# Patient Record
Sex: Male | Born: 1980 | Race: Asian | Hispanic: Yes | Marital: Married | Smoking: Never smoker
Health system: Southern US, Community
[De-identification: ages and names within clinical notes are randomized; demographics above are authoritative.]

## PROBLEM LIST (undated history)

## (undated) HISTORY — PX: HAND DEBRIDEMENT: SHX974

---

## 2018-12-18 ENCOUNTER — Encounter (HOSPITAL_COMMUNITY): Payer: Self-pay | Admitting: Emergency Medicine

## 2018-12-18 ENCOUNTER — Other Ambulatory Visit: Payer: Self-pay

## 2018-12-18 ENCOUNTER — Inpatient Hospital Stay (HOSPITAL_COMMUNITY)
Admission: EM | Admit: 2018-12-18 | Discharge: 2018-12-22 | DRG: 041 | Disposition: A | Discharge status: Home / Self Care | Payer: Medicaid Other | Attending: Orthopedic Surgery | Admitting: Orthopedic Surgery

## 2018-12-18 ENCOUNTER — Encounter (HOSPITAL_COMMUNITY)
Admission: EM | Disposition: A | Discharge status: Home / Self Care | Payer: Self-pay | Source: Home / Self Care | Attending: Orthopedic Surgery

## 2018-12-18 ENCOUNTER — Emergency Department (HOSPITAL_COMMUNITY): Payer: Medicaid Other | Admitting: Certified Registered Nurse Anesthetist

## 2018-12-18 ENCOUNTER — Ambulatory Visit (INDEPENDENT_AMBULATORY_CARE_PROVIDER_SITE_OTHER): Payer: Self-pay

## 2018-12-18 ENCOUNTER — Ambulatory Visit (HOSPITAL_COMMUNITY)
Admission: EM | Admit: 2018-12-18 | Discharge: 2018-12-18 | Disposition: A | Discharge status: Home / Self Care | Payer: Self-pay | Attending: Family Medicine | Admitting: Family Medicine

## 2018-12-18 DIAGNOSIS — W3189XA Contact with other specified machinery, initial encounter: Secondary | ICD-10-CM | POA: Diagnosis not present

## 2018-12-18 DIAGNOSIS — S6981XA Other specified injuries of right wrist, hand and finger(s), initial encounter: Secondary | ICD-10-CM

## 2018-12-18 DIAGNOSIS — Z1159 Encounter for screening for other viral diseases: Secondary | ICD-10-CM | POA: Diagnosis not present

## 2018-12-18 DIAGNOSIS — L03113 Cellulitis of right upper limb: Secondary | ICD-10-CM | POA: Diagnosis present

## 2018-12-18 DIAGNOSIS — S64498A Injury of digital nerve of other finger, initial encounter: Principal | ICD-10-CM | POA: Diagnosis present

## 2018-12-18 DIAGNOSIS — W298XXA Contact with other powered powered hand tools and household machinery, initial encounter: Secondary | ICD-10-CM

## 2018-12-18 DIAGNOSIS — Y990 Civilian activity done for income or pay: Secondary | ICD-10-CM | POA: Diagnosis not present

## 2018-12-18 DIAGNOSIS — Z23 Encounter for immunization: Secondary | ICD-10-CM

## 2018-12-18 HISTORY — PX: I & D EXTREMITY: SHX5045

## 2018-12-18 HISTORY — PX: NERVE, TENDON AND ARTERY REPAIR: SHX5695

## 2018-12-18 LAB — CBC
HCT: 44.2 % (ref 39.0–52.0)
Hemoglobin: 14.7 g/dL (ref 13.0–17.0)
MCH: 29.6 pg (ref 26.0–34.0)
MCHC: 33.3 g/dL (ref 30.0–36.0)
MCV: 88.9 fL (ref 80.0–100.0)
Platelets: 172 10*3/uL (ref 150–400)
RBC: 4.97 MIL/uL (ref 4.22–5.81)
RDW: 12.7 % (ref 11.5–15.5)
WBC: 8 10*3/uL (ref 4.0–10.5)
nRBC: 0 % (ref 0.0–0.2)

## 2018-12-18 LAB — BASIC METABOLIC PANEL
Anion gap: 9 (ref 5–15)
BUN: 11 mg/dL (ref 6–20)
CO2: 22 mmol/L (ref 22–32)
Calcium: 8.9 mg/dL (ref 8.9–10.3)
Chloride: 105 mmol/L (ref 98–111)
Creatinine, Ser: 0.91 mg/dL (ref 0.61–1.24)
GFR calc Af Amer: >60 mL/min (ref >60)
GFR calc non Af Amer: >60 mL/min (ref >60)
Glucose, Bld: 95 mg/dL (ref 70–99)
Potassium: 3.7 mmol/L (ref 3.5–5.1)
Sodium: 136 mmol/L (ref 135–145)

## 2018-12-18 LAB — SARS CORONAVIRUS 2 BY RT PCR (HOSPITAL ORDER, PERFORMED IN ~~LOC~~ HOSPITAL LAB): SARS Coronavirus 2: NEGATIVE

## 2018-12-18 SURGERY — IRRIGATION AND DEBRIDEMENT EXTREMITY
Anesthesia: General | Site: Hand | Laterality: Right

## 2018-12-18 MED ORDER — LACTATED RINGERS IV SOLN: INTRAVENOUS | Status: DC

## 2018-12-18 MED ORDER — PIPERACILLIN-TAZOBACTAM 3.375 G IVPB 30 MIN
3.3750 g | Freq: Once | INTRAVENOUS | Status: AC
  Administered 2018-12-18: 17:00:00 3.375 g via INTRAVENOUS
  Filled 2018-12-18: qty 50

## 2018-12-18 MED ORDER — VANCOMYCIN HCL 10 G IV SOLR
1500.0000 mg | Freq: Two times a day (BID) | INTRAVENOUS | Status: DC
  Administered 2018-12-19 – 2018-12-22 (×7): 1500 mg via INTRAVENOUS
  Filled 2018-12-18 (×8): qty 1500

## 2018-12-18 MED ORDER — DEXAMETHASONE SODIUM PHOSPHATE 10 MG/ML IJ SOLN
INTRAMUSCULAR | Status: DC | PRN
  Administered 2018-12-18: 10 mg via INTRAVENOUS

## 2018-12-18 MED ORDER — FENTANYL CITRATE (PF) 250 MCG/5ML IJ SOLN
INTRAMUSCULAR | Status: DC | PRN
  Administered 2018-12-18: 25 ug via INTRAVENOUS
  Administered 2018-12-18: 50 ug via INTRAVENOUS
  Administered 2018-12-18 (×2): 25 ug via INTRAVENOUS
  Administered 2018-12-18: 100 ug via INTRAVENOUS
  Administered 2018-12-18: 25 ug via INTRAVENOUS

## 2018-12-18 MED ORDER — ACETAMINOPHEN 500 MG PO TABS: 1000.0000 mg | ORAL_TABLET | Freq: Once | ORAL | Status: DC | PRN

## 2018-12-18 MED ORDER — VANCOMYCIN HCL IN DEXTROSE 1-5 GM/200ML-% IV SOLN
1000.0000 mg | Freq: Once | INTRAVENOUS | Status: AC
  Administered 2018-12-18: 17:00:00 1500 mg via INTRAVENOUS
  Filled 2018-12-18: qty 200

## 2018-12-18 MED ORDER — SUCCINYLCHOLINE CHLORIDE 200 MG/10ML IV SOSY
PREFILLED_SYRINGE | INTRAVENOUS | Status: DC | PRN
  Administered 2018-12-18: 80 mg via INTRAVENOUS

## 2018-12-18 MED ORDER — PROPOFOL 10 MG/ML IV BOLUS
INTRAVENOUS | Status: DC | PRN
  Administered 2018-12-18: 200 mg via INTRAVENOUS

## 2018-12-18 MED ORDER — TETANUS-DIPHTH-ACELL PERTUSSIS 5-2.5-18.5 LF-MCG/0.5 IM SUSP
INTRAMUSCULAR | Status: AC
  Filled 2018-12-18: qty 0.5

## 2018-12-18 MED ORDER — METHOCARBAMOL 1000 MG/10ML IJ SOLN
500.0000 mg | Freq: Four times a day (QID) | INTRAVENOUS | Status: DC | PRN
  Filled 2018-12-18: qty 5

## 2018-12-18 MED ORDER — FENTANYL CITRATE (PF) 100 MCG/2ML IJ SOLN
INTRAMUSCULAR | Status: AC
  Filled 2018-12-18: qty 2

## 2018-12-18 MED ORDER — MIDAZOLAM HCL 2 MG/2ML IJ SOLN
INTRAMUSCULAR | Status: DC | PRN
  Administered 2018-12-18: 2 mg via INTRAVENOUS

## 2018-12-18 MED ORDER — OXYCODONE HCL 5 MG PO TABS: 5.0000 mg | ORAL_TABLET | Freq: Once | ORAL | Status: DC | PRN

## 2018-12-18 MED ORDER — ACETAMINOPHEN 10 MG/ML IV SOLN: 1000.0000 mg | Freq: Once | INTRAVENOUS | Status: DC | PRN

## 2018-12-18 MED ORDER — OXYCODONE HCL 5 MG/5ML PO SOLN: 5.0000 mg | Freq: Once | ORAL | Status: DC | PRN

## 2018-12-18 MED ORDER — LACTATED RINGERS IV SOLN
INTRAVENOUS | Status: DC
  Administered 2018-12-18 – 2018-12-21 (×3): via INTRAVENOUS

## 2018-12-18 MED ORDER — ALPRAZOLAM 0.5 MG PO TABS: 0.5000 mg | ORAL_TABLET | Freq: Four times a day (QID) | ORAL | Status: DC | PRN

## 2018-12-18 MED ORDER — FENTANYL CITRATE (PF) 250 MCG/5ML IJ SOLN
INTRAMUSCULAR | Status: AC
  Filled 2018-12-18: qty 5

## 2018-12-18 MED ORDER — MIDAZOLAM HCL 2 MG/2ML IJ SOLN
INTRAMUSCULAR | Status: AC
  Filled 2018-12-18: qty 2

## 2018-12-18 MED ORDER — ONDANSETRON HCL 4 MG/2ML IJ SOLN
INTRAMUSCULAR | Status: DC | PRN
  Administered 2018-12-18: 4 mg via INTRAVENOUS

## 2018-12-18 MED ORDER — OXYCODONE HCL 5 MG PO TABS: 5.0000 mg | ORAL_TABLET | ORAL | Status: DC | PRN

## 2018-12-18 MED ORDER — OXYCODONE HCL 5 MG PO TABS: 10.0000 mg | ORAL_TABLET | ORAL | Status: DC | PRN

## 2018-12-18 MED ORDER — PROMETHAZINE HCL 12.5 MG RE SUPP: 12.5000 mg | Freq: Four times a day (QID) | RECTAL | Status: DC | PRN

## 2018-12-18 MED ORDER — SODIUM CHLORIDE 0.9 % IR SOLN
Status: DC | PRN
  Administered 2018-12-18 (×3): 3000 mL

## 2018-12-18 MED ORDER — ACETAMINOPHEN 500 MG PO TABS
1000.0000 mg | ORAL_TABLET | Freq: Four times a day (QID) | ORAL | Status: AC
  Administered 2018-12-18 – 2018-12-19 (×3): 1000 mg via ORAL
  Filled 2018-12-18 (×3): qty 2

## 2018-12-18 MED ORDER — ACETAMINOPHEN 325 MG PO TABS
325.0000 mg | ORAL_TABLET | Freq: Four times a day (QID) | ORAL | Status: DC | PRN
  Administered 2018-12-19: 325 mg via ORAL
  Administered 2018-12-20 – 2018-12-21 (×2): 650 mg via ORAL
  Filled 2018-12-18 (×4): qty 2

## 2018-12-18 MED ORDER — METHOCARBAMOL 500 MG PO TABS
500.0000 mg | ORAL_TABLET | Freq: Four times a day (QID) | ORAL | Status: DC | PRN
  Administered 2018-12-19: 500 mg via ORAL
  Filled 2018-12-18 (×2): qty 1

## 2018-12-18 MED ORDER — DOCUSATE SODIUM 100 MG PO CAPS
100.0000 mg | ORAL_CAPSULE | Freq: Two times a day (BID) | ORAL | Status: DC
  Administered 2018-12-18 – 2018-12-20 (×4): 100 mg via ORAL
  Filled 2018-12-18 (×7): qty 1

## 2018-12-18 MED ORDER — PIPERACILLIN-TAZOBACTAM 3.375 G IVPB
3.3750 g | Freq: Three times a day (TID) | INTRAVENOUS | Status: DC
  Administered 2018-12-18 – 2018-12-22 (×11): 3.375 g via INTRAVENOUS
  Filled 2018-12-18 (×12): qty 50

## 2018-12-18 MED ORDER — ONDANSETRON HCL 4 MG PO TABS: 4.0000 mg | ORAL_TABLET | Freq: Four times a day (QID) | ORAL | Status: DC | PRN

## 2018-12-18 MED ORDER — ACETAMINOPHEN 160 MG/5ML PO SOLN: 1000.0000 mg | Freq: Once | ORAL | Status: DC | PRN

## 2018-12-18 MED ORDER — PROPOFOL 10 MG/ML IV BOLUS
INTRAVENOUS | Status: AC
  Filled 2018-12-18: qty 20

## 2018-12-18 MED ORDER — TETANUS-DIPHTH-ACELL PERTUSSIS 5-2.5-18.5 LF-MCG/0.5 IM SUSP
0.5000 mL | Freq: Once | INTRAMUSCULAR | Status: AC
  Administered 2018-12-18: 0.5 mL via INTRAMUSCULAR

## 2018-12-18 MED ORDER — VANCOMYCIN HCL 500 MG IV SOLR
500.0000 mg | Freq: Once | INTRAVENOUS | Status: DC
  Filled 2018-12-18: qty 500

## 2018-12-18 MED ORDER — ONDANSETRON HCL 4 MG/2ML IJ SOLN: 4.0000 mg | Freq: Four times a day (QID) | INTRAMUSCULAR | Status: DC | PRN

## 2018-12-18 MED ORDER — FENTANYL CITRATE (PF) 100 MCG/2ML IJ SOLN
25.0000 ug | INTRAMUSCULAR | Status: DC | PRN
  Administered 2018-12-18 (×2): 50 ug via INTRAVENOUS

## 2018-12-18 MED ORDER — VITAMIN C 500 MG PO TABS
1000.0000 mg | ORAL_TABLET | Freq: Every day | ORAL | Status: DC
  Administered 2018-12-18 – 2018-12-22 (×4): 1000 mg via ORAL
  Filled 2018-12-18 (×5): qty 2

## 2018-12-18 MED ORDER — HYDROMORPHONE HCL 1 MG/ML IJ SOLN: 0.5000 mg | INTRAMUSCULAR | Status: DC | PRN

## 2018-12-18 MED ORDER — LACTATED RINGERS IV SOLN
INTRAVENOUS | Status: DC | PRN
  Administered 2018-12-18 (×2): via INTRAVENOUS

## 2018-12-18 MED ORDER — PHENYLEPHRINE HCL (PRESSORS) 10 MG/ML IV SOLN
INTRAVENOUS | Status: DC | PRN
  Administered 2018-12-18 (×2): 120 ug via INTRAVENOUS
  Administered 2018-12-18 (×2): 80 ug via INTRAVENOUS

## 2018-12-18 MED ORDER — LIDOCAINE 2% (20 MG/ML) 5 ML SYRINGE
INTRAMUSCULAR | Status: DC | PRN
  Administered 2018-12-18: 60 mg via INTRAVENOUS

## 2018-12-18 SURGICAL SUPPLY — 39 items
BANDAGE ELASTIC 4 VELCRO ST LF (GAUZE/BANDAGES/DRESSINGS) ×4 IMPLANT
BLADE SURG 15 STRL LF DISP TIS (BLADE) ×2 IMPLANT
BLADE SURG 15 STRL SS (BLADE) ×2
BNDG CONFORM 3 STRL LF (GAUZE/BANDAGES/DRESSINGS) ×4 IMPLANT
BNDG ELASTIC 3X5.8 VLCR STR LF (GAUZE/BANDAGES/DRESSINGS) ×4 IMPLANT
BNDG GAUZE ELAST 4 BULKY (GAUZE/BANDAGES/DRESSINGS) ×8 IMPLANT
CORD BIPOLAR FORCEPS 12FT (ELECTRODE) ×4 IMPLANT
COVER SURGICAL LIGHT HANDLE (MISCELLANEOUS) ×4 IMPLANT
CUFF TOURN SGL QUICK 18X4 (TOURNIQUET CUFF) ×4 IMPLANT
DRSG ADAPTIC 3X8 NADH LF (GAUZE/BANDAGES/DRESSINGS) ×4 IMPLANT
GAUZE SPONGE 4X4 12PLY STRL (GAUZE/BANDAGES/DRESSINGS) ×4 IMPLANT
GAUZE XEROFORM 5X9 LF (GAUZE/BANDAGES/DRESSINGS) ×4 IMPLANT
GLOVE SS BIOGEL STRL SZ 8 (GLOVE) ×2 IMPLANT
GLOVE SUPERSENSE BIOGEL SZ 8 (GLOVE) ×2
GOWN STRL REUS W/ TWL LRG LVL3 (GOWN DISPOSABLE) ×2 IMPLANT
GOWN STRL REUS W/ TWL XL LVL3 (GOWN DISPOSABLE) ×2 IMPLANT
GOWN STRL REUS W/TWL LRG LVL3 (GOWN DISPOSABLE) ×2
GOWN STRL REUS W/TWL XL LVL3 (GOWN DISPOSABLE) ×2
KIT BASIN OR (CUSTOM PROCEDURE TRAY) ×4 IMPLANT
KIT TURNOVER KIT B (KITS) ×4 IMPLANT
LOOP VESSEL MINI RED (MISCELLANEOUS) ×4 IMPLANT
MANIFOLD NEPTUNE II (INSTRUMENTS) ×4 IMPLANT
NS IRRIG 1000ML POUR BTL (IV SOLUTION) ×4 IMPLANT
PACK ORTHO EXTREMITY (CUSTOM PROCEDURE TRAY) ×4 IMPLANT
PAD ARMBOARD 7.5X6 YLW CONV (MISCELLANEOUS) ×4 IMPLANT
PAD CAST 3X4 CTTN HI CHSV (CAST SUPPLIES) ×2 IMPLANT
PAD CAST 4YDX4 CTTN HI CHSV (CAST SUPPLIES) ×2 IMPLANT
PADDING CAST COTTON 3X4 STRL (CAST SUPPLIES) ×2
PADDING CAST COTTON 4X4 STRL (CAST SUPPLIES) ×2
SET CYSTO W/LG BORE CLAMP LF (SET/KITS/TRAYS/PACK) ×4 IMPLANT
SOL PREP POV-IOD 4OZ 10% (MISCELLANEOUS) ×8 IMPLANT
SPLINT FIBERGLASS 3X12 (CAST SUPPLIES) ×4 IMPLANT
SUT PROLENE 4 0 PS 2 18 (SUTURE) ×8 IMPLANT
TOWEL GREEN STERILE (TOWEL DISPOSABLE) ×4 IMPLANT
TOWEL GREEN STERILE FF (TOWEL DISPOSABLE) ×4 IMPLANT
TUBE CONNECTING 12'X1/4 (SUCTIONS) ×1
TUBE CONNECTING 12X1/4 (SUCTIONS) ×3 IMPLANT
WATER STERILE IRR 1000ML POUR (IV SOLUTION) ×4 IMPLANT
YANKAUER SUCT BULB TIP NO VENT (SUCTIONS) ×4 IMPLANT

## 2018-12-18 NOTE — Discharge Instructions (Addendum)
Meds ordered this encounter  Medications   Tdap (BOOSTRIX) injection 0.5 mL    

## 2018-12-18 NOTE — ED Provider Notes (Signed)
MSE was initiated and I personally evaluated the patient and placed orders (if any) at  1:39 PM on December 18, 2018.  The patient appears stable so that the remainder of the MSE may be completed by another provider.  Received patient in transfer from urgent care.  Had high-pressure injection injury to right hand between the second and third finger yesterday.  To see Dr. Amedeo Plenty for surgery.  Last ate around 9:00 this morning.  Otherwise healthy.  Will discuss with Dr. Amedeo Plenty about what needs to be done in the emergency room.  COVID test has been ordered.   Davonna Belling, MD 12/18/18 1340

## 2018-12-18 NOTE — Op Note (Signed)
Operative note 12/18/2018  Roseanne Kaufman MD  Preoperative diagnosis high-pressure latex paint injection injury to the right hand with involvement of the index and middle finger  Postop diagnosis the same process  Procedure #1 extensive open decompression high injection latex paint gun injury right hand with compartment release and debridement of skin muscle subcutaneous tissue tendon and associated structures #2 ulnar digital nerve neural lysis/neuroplasty right index finger #3 radial digital nerve neuro lysis/neuroplasty right middle finger #4 extensive dissection, digital artery to the second webspace and digital arteries including ulnar digital artery to the index finger and radial digital artery to the middle finger (proper digital artery dissection to the PIP level).  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  Yesterday he sustained the injury.  He presented to the emergency room and I asked that he be prepped for surgery immediately.  I discussed with the patient that these injuries have poor prognosis if they are not dealt with within 10 hours.  Although latex is less destructive than solvents it is quite troublesome some that the time duration from injury to presentation was greater than 24 hours.  Nevertheless, we can do everything we can to give him hand is functional and workable.  X-rays do show impregnation of latex throughout the hand.  Operative procedure: Patient was seen by myself and anesthesia given a general anesthetic and was  prepped draped new sterile fashion with Hibiclens scrub followed by Betadine scrub and paint.  Once this was complete the timeout was observed preoperative antibiotics were given in form of vancomycin and Zosyn which she will be continued on.  I then made a modified Bruner incision beginning in the palm extending to the second web space where the insult occurred.  Following this we then very carefully and cautiously extended the burn incision about the ulnar aspect of the index finger and the radial aspect of the middle finger.  Skin flaps were elevated.  The webspace had impregnation of latex which looked horrible.  At this time it was quite apparent that he would need a extensive radial digital nerve and artery dissection as well as an ulnar digital nerve and artery dissection to the middle and index finger respectively.  In doing so this required dissection of the common digital artery and the common digital nerve back to the palm region.  Vessel loop drains were placed around the neurovascular structures.  This was an extensive radial digital nerve neuroplasty and neuro lysis I swept away and removed copious amounts of latex from this.  The ulnar digital nerve had similar treatment.  The radial digital nerve to the middle finger had significant impregnated latex as did the artery.  We very carefully picked these latex particles away from it and then placed Vesseloops around this.  We traced back to the common digital artery and common digital nerve and put additional Vesseloops around the structures.  This was an extensive nerve dissection and arterial dissection.  Following this the ulnar digital nerve to the index finger and artery underwent extensive dissection.  Portions of the  deep transverse arterial tree were identified and very carefully protected.  I placed vessel loop drains around the index finger ulnar digital vessels.  My initial dissection was simply to dissect out the structures and remove  the latex from them so that we would not injure the finger.  Following this I performed identification of the flexor sheath about the middle finger there was questionable encroachment thus we open the area about the A3 pulley away from any region where skin issue would be a problem and dissected this area.  A flexor digitorum profundus and superficialis tenolysis tenosynovectomy was accomplished and there is no evidence of advanced latex impregnation.  I did perform a look at the flexor sheath of the index finger I felt comfortable that it was not encroached upon.  Following this we then set out to remove latex about all areas.  This took a great deal of time just under 2 hours of tourniquet.  Following this I then performed a extensor tendon tenolysis tenosynovectomy from a volar to dorsal approach and removed the tendons from adherence to the latex.  Patient tolerated this well.  Following this we removed additional latex material of course and then placed 9 L of fluid through cystoscopy tubing about the hand.  Following this I then removed some area of devitalized tissue about the web which allowed for a web deepening.  Once this was complete we then additionally irrigated a bit more.  Following this I tacked the wound closed and I will plan to bring the patient back to the operative theater in the next 48 to 72 hours for repeat irrigation and debridement and check wound conditions.  We did check cultures today/took cultures today.  The patient tolerated the procedure well had a warm pink hand at the conclusion of the procedure we dressed him with Adaptic Xeroform sterile gauze and associated bulky dressing with a volar splint.  We will plan for vancomycin and Zosyn.  We will monitor the cultures closely.  I have given a variable prognosis given the timeframe duration from injury to presentation but I am hopeful that we may have a good result given the fact that the tendon sheaths were not overly  burdened with latex.  This may require some degree of skin grafting or rotation flap with web deepening given the impregnation and propensity towards necrosis.  We will let time declare and move forward accordingly.  Robynn Marcel MD

## 2018-12-18 NOTE — Anesthesia Preprocedure Evaluation (Signed)
Anesthesia Evaluation  Patient identified by MRN, date of birth, ID band Patient awake    Reviewed: Allergy & Precautions, NPO status , Patient's Chart, lab work & pertinent test results  History of Anesthesia Complications Negative for: history of anesthetic complications  Airway Mallampati: II  TM Distance: >3 FB Neck ROM: Full    Dental  (+) Teeth Intact, Dental Advisory Given   Pulmonary neg pulmonary ROS,    breath sounds clear to auscultation       Cardiovascular negative cardio ROS   Rhythm:Regular     Neuro/Psych negative neurological ROS  negative psych ROS   GI/Hepatic negative GI ROS, Neg liver ROS,   Endo/Other  negative endocrine ROS  Renal/GU negative Renal ROS     Musculoskeletal : Right Hand Injury    Abdominal   Peds  Hematology   Anesthesia Other Findings   Reproductive/Obstetrics negative OB ROS                             Anesthesia Physical Anesthesia Plan  ASA: I  Anesthesia Plan: General   Post-op Pain Management:    Induction: Intravenous, Rapid sequence and Cricoid pressure planned  PONV Risk Score and Plan: 2 and Ondansetron and Dexamethasone  Airway Management Planned: Oral ETT  Additional Equipment: None  Intra-op Plan:   Post-operative Plan: Extubation in OR  Informed Consent: I have reviewed the patients History and Physical, chart, labs and discussed the procedure including the risks, benefits and alternatives for the proposed anesthesia with the patient or authorized representative who has indicated his/her understanding and acceptance.     Dental advisory given  Plan Discussed with: CRNA and Surgeon  Anesthesia Plan Comments:         Anesthesia Quick Evaluation

## 2018-12-18 NOTE — Transfer of Care (Signed)
Immediate Anesthesia Transfer of Care Note  Patient: Edward Walker  Procedure(s) Performed: IRRIGATION AND DEBRIDEMENT HAND (Right )  Patient Location: PACU  Anesthesia Type:General  Level of Consciousness: awake, alert  and oriented  Airway & Oxygen Therapy: Patient Spontanous Breathing  Post-op Assessment: Report given to RN and Post -op Vital signs reviewed and stable  Post vital signs: Reviewed and stable  Last Vitals:  Vitals Value Taken Time  BP 140/97 12/18/18 1844  Temp 36.7 C 12/18/18 1844  Pulse 107 12/18/18 1846  Resp 23 12/18/18 1846  SpO2 99 % 12/18/18 1846  Vitals shown include unvalidated device data.  Last Pain:  Vitals:   12/18/18 1844  TempSrc:   PainSc: (P) 0-No pain         Complications: No apparent anesthesia complications

## 2018-12-18 NOTE — ED Provider Notes (Addendum)
[facility]   [id number] 12/18/18 Arrival Time: 1148  ASSESSMENT & PLAN:  1. High pressure injury of right hand     Discussed with Dr. Amanda PeaGramig. Sending Edward Walker to ED for rapid COVID-19 testing in anticipation of surgery today.  Last PO intake approx three hours ago.  I have personally viewed the imaging studies ordered this visit. Evidence of foreign material at site of pressure injury.  Meds ordered this encounter  Medications  . Tdap (BOOSTRIX) injection 0.5 mL    Follow-up Information    Go to  [facility].   Specialty: Emergency Medicine Contact information: [address] [id number] [place] [id number] [phone]       Dominica SeverinGramig, William, MD.   Specialty: Orthopedic Surgery Why: Dr Amanda PeaGramig will see you in the Emergency Department today in order to discuss surgery on your hand. Contact information: [address] [place] [address] [phone]           Reviewed expectations re: course of current medical issues. Questions answered. Outlined signs and symptoms indicating need for more acute intervention. Patient verbalized understanding. After Visit Summary given.  SUBJECTIVE: History from: patient. Edward Walker is a R-handed 38 y.o. male who reports non-radiating persistent pain and swelling of his RIGHT hand. Reports high pressure (2000 psi) latex paint sprayer injury yesterday. Wand several inches from hand at time of injury. Immediate pain. Washed afterward. Has since noticed significant swelling and redness. Drainage of "pus" reported yesterday evening; none today. No extremity sensation changes or weakness. Increased pain with attempted finger flexion; limited ROM secondary to pain and swelling. No fever reported.  History reviewed. No pertinent surgical history.   ROS: As per HPI. All other systems negative.    OBJECTIVE:  Vitals:   12/18/18 1212  BP:  115/72  Pulse: (!) 103  Resp: 18  Temp: 100.2 F (37.9 C)  TempSrc: Oral  SpO2: 99%    General appearance: alert; no distress HEENT: [place]; AT Neck: supple with FROM CV: regular; slight tachycardia Resp: unlabored respirations Extremities: . RUE: warm and well perfused; fairly well localized marked tenderness over right hand around base of index finger; closed small wound with surrounding erythema; without gross deformities; with marked swelling; with no bruising; finger ROM: limited by swelling CV: brisk extremity capillary refill of fingers of right hand; 2+ radial pulse of RUE. 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 Remote RIGHT hand fractures 25+ years ago. EXAM: RIGHT HAND - COMPLETE 3+ VIEW COMPARISON:  None. FINDINGS: High attenuation foreign material in the webspace between the index and long fingers. No evidence of acute fracture. Well-preserved bone mineral density. Well preserved joint spaces. No intrinsic osseous abnormalities. IMPRESSION: 1. No osseous abnormality. 2. High attenuation foreign material in the webspace between the index and long fingers which goes along with the given history of injected paint. Electronically Signed   By: Hulan Saashomas  Lawrence M.D.   On: 12/18/2018 12:42      No Known Allergies  PMH: "Healthy."  Social History   Socioeconomic History  . Marital status: Married    Spouse name: Not on file  . Number of children: Not on file  . Years of education:  Not on file  . Highest education level: Not on file  Occupational History  . Not on file  Social Needs  . Financial resource strain: Not on file  . Food insecurity    Worry: Not on file     Inability: Not on file  . Transportation needs    Medical: Not on file    Non-medical: Not on file  Tobacco Use  . Smoking status: Never Smoker  Substance and Sexual Activity  . Alcohol use: Yes  . Drug use: Not Currently  . Sexual activity: Not on file  Lifestyle  . Physical activity    Days per week: Not on file    Minutes per session: Not on file  . Stress: Not on file  Relationships  . Social Herbalist on phone: Not on file    Gets together: Not on file    Attends religious service: Not on file    Active member of club or organization: Not on file    Attends meetings of clubs or organizations: Not on file    Relationship status: Not on file  Other Topics Concern  . Not on file  Social History Narrative  . Not on file   FH: Unknown.  History reviewed. No pertinent surgical history.    Vanessa Kick, MD 12/18/18 1317    Vanessa Kick, MD 12/18/18 [phone]

## 2018-12-18 NOTE — H&P (Signed)
Edward Walker is an 38 y.o. male.   Chief Complaint: Patient presents after a high-pressure latex gun injury to his right hand 12/17/2018 at noon.  He is now greater than 24 hours out from his injury and complains of pain inflammation cellulitis and erythema. HPI: I was asked to see this patient in regards to his upper extremity predicament.  He presented to the urgent care and I immediately asked that he be prepped for surgery due to a high-pressure latex gun injury to his hand greater than 24 hours ago.  He complains of pain swelling and difficulty using the hand.  He states that last night he was painful and this morning this worsened.  I reviewed his hand at length which shows a wound about the second webspace in between the index and middle finger.  He has latex in this wound.  X-rays confirm latex extravasation about the hand.  He denies neck back chest or abdominal pain and denies other history of injury.  History reviewed. No pertinent past medical history.  History reviewed. No pertinent surgical history.  History reviewed. No pertinent family history. Social History:  reports that he has never smoked. He has never used smokeless tobacco. He reports current alcohol use. He reports previous drug use.  Allergies: No Known Allergies  No medications prior to admission.    Results for orders placed or performed during the hospital encounter of 12/18/18 (from the past 48 hour(s))  SARS Coronavirus 2 (CEPHEID - Performed in [facility] hospital lab), Hosp Order     Status: None   Collection Time: 12/18/18  1:33 PM   Specimen: Nasopharyngeal Swab  Result Value Ref Range   SARS Coronavirus 2 NEGATIVE NEGATIVE    Comment: (NOTE) If result is NEGATIVE SARS-CoV-2 target nucleic acids are NOT DETECTED. The SARS-CoV-2 RNA is generally detectable in upper and lower  respiratory specimens during the acute phase of infection. The lowest  concentration of SARS-CoV-2 viral copies this assay  can detect is 250  copies / mL. A negative result does not preclude SARS-CoV-2 infection  and should not be used as the sole basis for treatment or other  patient management decisions.  A negative result may occur with  improper specimen collection / handling, submission of specimen other  than nasopharyngeal swab, presence of viral mutation(s) within the  areas targeted by this assay, and inadequate number of viral copies  (<250 copies / mL). A negative result must be combined with clinical  observations, patient history, and epidemiological information. If result is POSITIVE SARS-CoV-2 target nucleic acids are DETECTED. The SARS-CoV-2 RNA is generally detectable in upper and lower  respiratory specimens dur ing the acute phase of infection.  Positive  results are indicative of active infection with SARS-CoV-2.  Clinical  correlation with patient history and other diagnostic information is  necessary to determine patient infection status.  Positive results do  not rule out bacterial infection or co-infection with other viruses. If result is PRESUMPTIVE POSTIVE SARS-CoV-2 nucleic acids MAY BE PRESENT.   A presumptive positive result was obtained on the submitted specimen  and confirmed on repeat testing.  While 2019 novel coronavirus  (SARS-CoV-2) nucleic acids may be present in the submitted sample  additional confirmatory testing may be necessary for epidemiological  and / or clinical management purposes  to differentiate between  SARS-CoV-2 and other Sarbecovirus currently known to infect humans.  If clinically indicated additional testing with an alternate test  methodology [phone]) is advised. The SARS-CoV-2 RNA  is generally  detectable in upper and lower respiratory sp ecimens during the acute  phase of infection. The expected result is Negative. Fact Sheet for Patients:  BoilerBrush.com.cyhttps://www.fda.gov/media/136312/download Fact Sheet for Healthcare  Providers: https://pope.com/https://www.fda.gov/media/136313/download This test is not yet approved or cleared by the [place] FDA and has been authorized for detection and/or diagnosis of SARS-CoV-2 by FDA under an Emergency Use Authorization (EUA).  This EUA will remain in effect (meaning this test can be used) for the duration of the COVID-19 declaration under Section 564(b)(1) of the Act, 21 U.S.C. section 360bbb-3(b)(1), unless the authorization is terminated or revoked sooner. Performed at [facility], [address] [place], [place] [address]   Basic metabolic panel     Status: None   Collection Time: 12/18/18  2:50 PM  Result Value Ref Range   Sodium 136 135 - 145 mmol/L   Potassium 3.7 3.5 - 5.1 mmol/L   Chloride 105 98 - 111 mmol/L   CO2 22 22 - 32 mmol/L   Glucose, Bld 95 70 - 99 mg/dL   BUN 11 6 - 20 mg/dL   Creatinine, Ser 6.040.91 0.61 - 1.24 mg/dL   Calcium 8.9 8.9 - 54.010.3 mg/dL   GFR calc non Af Amer >60 >60 mL/min   GFR calc Af Amer >60 >60 mL/min   Anion gap 9 5 - 15    Comment: Performed at [facility], [address] [place], [place] [address]  CBC     Status: None   Collection Time: 12/18/18  2:50 PM  Result Value Ref Range   WBC 8.0 4.0 - 10.5 K/uL   RBC 4.97 4.22 - 5.81 MIL/uL   Hemoglobin 14.7 13.0 - 17.0 g/dL   HCT 14.744.2 82.939.0 - 56.252.0 %   MCV 88.9 80.0 - 100.0 fL   MCH 29.6 26.0 - 34.0 pg   MCHC 33.3 30.0 - 36.0 g/dL   RDW 13.012.7 86.511.5 - 78.415.5 %   Platelets 172 150 - 400 K/uL   nRBC 0.0 0.0 - 0.2 %    Comment: Performed at [facility], 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 Remote RIGHT hand fractures 25+ years ago. EXAM: RIGHT HAND - COMPLETE 3+ VIEW COMPARISON:  None. FINDINGS: High attenuation foreign material in the webspace between the index and long  fingers. No evidence of acute fracture. Well-preserved bone mineral density. Well preserved joint spaces. No intrinsic osseous abnormalities. IMPRESSION: 1. No osseous abnormality. 2. High attenuation foreign material in the webspace between the index and long fingers which goes along with the given history of injected paint. Electronically Signed   By: Hulan Saashomas  Lawrence M.D.   On: 12/18/2018 12:42    Review of Systems  Respiratory: Negative.   Cardiovascular: Negative.   Gastrointestinal: Negative.   Genitourinary: Negative.     Blood pressure 124/90, pulse 95, temperature 99.9 F (37.7 C), temperature source Oral, resp. rate 18, SpO2 95 %. Physical Exam  High-pressure latex gun injury to the affected right hand entrance wound about the second webspace volar in location between the index and middle finger.  Extravasation extends throughout the fingers and palmar region.  He has difficulty flexing and extending the fingers and it is painful with cellulitis and loss of function.  He denies other injury.  I thoroughly  reviewed his chart and the x-rays.   The patient is alert and oriented in no acute distress. The patient complains of pain in the affected upper extremity.  The patient is noted to have a normal HEENT exam. Lung fields show equal chest expansion and no shortness of breath. Abdomen exam is nontender without distention. Lower extremity examination does not show any fracture dislocation or blood clot symptoms. Pelvis is stable and the neck and back are stable and nontender. Assessment/Plan High-pressure latex gun injury to the hand with extravasation of latex throughout soft tissues.  We will plan for urgent irrigation debridement repair and reconstruction.  We will have to perform digital nerve and artery evaluation decompression neuroplasty and flexor tendon tenolysis Tina synovectomy as necessary.  I would plan for an extended incision and a loose closure with IV  antibiotics and a careful approach to his postop management algorithm.  I discussed with him that patients with high pressure injury is greater than 10 hours old have a much higher risk of amputation of up to greater than 50% in some of our scientific papers.  At present juncture we do know that latex is less aggressive than solvents in terms of tissue destruction but I certainly do fear the worst given the timeframe duration from injury to presentation.  Nevertheless I would go all out to try and give him the best hand possible and he understands this.  We will move forward urgently with surgery.  We are planning surgery for your upper extremity. The risk and benefits of surgery to include risk of bleeding, infection, anesthesia,  damage to normal structures and failure of the surgery to accomplish its intended goals of relieving symptoms and restoring function have been discussed in detail. With this in mind we plan to proceed. I have specifically discussed with the patient the pre-and postoperative regime and the dos and don'ts and risk and benefits in great detail. Risk and benefits of surgery also include risk of dystrophy(CRPS), chronic nerve pain, failure of the healing process to go onto completion and other inherent risks of surgery The relavent the pathophysiology of the disease/injury process, as well as the alternatives for treatment and postoperative course of action has been discussed in great detail with the patient who desires to proceed.  We will do everything in our power to help you (the patient) restore function to the upper extremity. It is a pleasure to see this patient today.   Oletta CohnWilliam M Kaileen Bronkema III, MD 12/18/2018, 3:58 PM

## 2018-12-18 NOTE — Progress Notes (Signed)
Pharmacy Antibiotic Note  Edward Walker is a 38 y.o. male admitted on 12/18/2018 with cellulitis.  Pharmacy has been consulted for vanc and zosyn dosing.  Received zosyn 3.375g x1 and vanc 1g x1 periop.  SCr 0.91  Plan: Vancomycin 1500mg  IV every 12h (calc AUC 526) Zosyn 3.375g IV every 8 hours Monitor renal function, clinical progression and Cx to narrow  Height: 5\' 9"  (175.3 cm) Weight: 168 lb (76.2 kg) IBW/kg (Calculated) : 70.7  Temp (24hrs), Avg:98.5 F (36.9 C), Min:97.3 F (36.3 C), Max:100.2 F (37.9 C)  Recent Labs  Lab 12/18/18 1450  WBC 8.0  CREATININE 0.91    Estimated Creatinine Clearance: 111.1 mL/min (by C-G formula based on SCr of 0.91 mg/dL).    No Known Allergies  Bertis Ruddy, PharmD Clinical Pharmacist Please check AMION for all [place] numbers 12/18/2018 8:11 PM

## 2018-12-18 NOTE — ED Triage Notes (Signed)
Pt states yesterday he was using a high powered spray paint machine and accidentally hit the palm of his hand, pt has redness and swelling to R hand, pt is also tachy and mildly febrile. Pt denies any symptoms of possible covid. Pt statrse pus was coming out of it yesterday.

## 2018-12-18 NOTE — Progress Notes (Signed)
One bag taken to PACU with cell phone, clothing, wedding band, and fit bit in bag. Wedding band and fit bit were placed in a pink denture cup inside of bag.

## 2018-12-18 NOTE — Anesthesia Procedure Notes (Signed)
Procedure Name: Intubation Date/Time: 12/18/2018 4:24 PM Performed by: Clearnce Sorrel, CRNA Pre-anesthesia Checklist: Patient identified, Emergency Drugs available, Suction available, Patient being monitored and Timeout performed Patient Re-evaluated:Patient Re-evaluated prior to induction Oxygen Delivery Method: Circle system utilized Preoxygenation: Pre-oxygenation with 100% oxygen Induction Type: IV induction, Rapid sequence and Cricoid Pressure applied Laryngoscope Size: Mac and 4 Grade View: Grade I Tube type: Oral Tube size: 7.5 mm Number of attempts: 1 Airway Equipment and Method: Stylet Placement Confirmation: ETT inserted through vocal cords under direct vision,  positive ETCO2 and breath sounds checked- equal and bilateral Secured at: 23 cm Tube secured with: Tape Dental Injury: Teeth and Oropharynx as per pre-operative assessment

## 2018-12-18 NOTE — ED Triage Notes (Signed)
Pt here from [facility] to See Dr for possible surgery to the right hand after an injury with a paint spray gun

## 2018-12-19 NOTE — Plan of Care (Signed)
  Problem: Pain Managment: Goal: General experience of comfort will improve Outcome: Progressing   

## 2018-12-19 NOTE — Progress Notes (Signed)
Patient ID: Edward Walker, male   DOB: 1980-09-17, 38 y.o.   MRN: [id number] Patient seen and evaluated at bedside.  Patient is awake alert and oriented.  We discussed his intraoperative findings and the neck steps involved in his care plan.  At present juncture he is moving the tips of his fingers well.  I discussed with him the plan for repeat irrigation and debridement Tuesday.  We will see how the tissue declares itself in terms of necrosis.  Should any worsening occur he will notify us.  Today and tomorrow we will plan for elevation IV antibiotics and a trip back to the OR Tuesday for repeat irrigation and debridement and possible wound closure.  Last night we just simply tacked his wound closed to see where he goes in terms of declaration of the wound into the future.  I discussed with him the nature of high-pressure latex paint injection injuries in the hand and other issues.  I discussed all issues with nursing staff.  The patient is alert and oriented in no acute distress. The patient complains of pain in the affected upper extremity.  The patient is noted to have a normal HEENT exam. Lung fields show equal chest expansion and no shortness of breath. Abdomen exam is nontender without distention. Lower extremity examination does not show any fracture dislocation or blood clot symptoms. Pelvis is stable and the neck and back are stable and nontender.  Jaymon Dudek MD

## 2018-12-19 NOTE — Plan of Care (Signed)
  Problem: Education: Goal: Knowledge of General Education information will improve Description Including pain rating scale, medication(s)/side effects and non-pharmacologic comfort measures Outcome: Progressing   

## 2018-12-20 ENCOUNTER — Encounter (HOSPITAL_COMMUNITY): Payer: Self-pay | Admitting: Orthopedic Surgery

## 2018-12-20 LAB — CREATININE, SERUM
Creatinine, Ser: 1 mg/dL (ref 0.61–1.24)
GFR calc Af Amer: >60 mL/min (ref >60)
GFR calc non Af Amer: >60 mL/min (ref >60)

## 2018-12-20 NOTE — Progress Notes (Signed)
Patient ID: Edward Walker, male   DOB: [birth year], 38 y.o.   MRN: [id number] Patient is looking quite well.  He has no complaints.  Good early range of motion to the fingertips.  He will continue elevation antibiotics and will plan for repeat irrigation and debridement tomorrow with possible closure in a more permanent fashion if parameters look good.  The patient is alert and oriented in no acute distress. The patient complains of pain in the affected upper extremity.  The patient is noted to have a normal HEENT exam. Lung fields show equal chest expansion and no shortness of breath. Abdomen exam is nontender without distention. Lower extremity examination does not show any fracture dislocation or blood clot symptoms. Pelvis is stable and the neck and back are stable and nontender.   Once again we will plan for surgical irrigation debridement tomorrow. Continue current antibiotic regime-cultures negative to date All questions have been addressed.  Kairen Hallinan MD

## 2018-12-20 NOTE — TOC Initial Note (Addendum)
Transition of Care [facility]) - Initial/Assessment Note    Patient Details  Name: Hardie Veltre MRN: [id number] Date of Birth: 1980-08-06  [facility] of [facility] CM/SW Contact:    Marilu Favre, RN Phone Number: 12/20/2018, 10:05 AM  Clinical Narrative:                 Confirmed face sheet information.   Patient wants to follow up with [facility], will call for appointment. Scheduled appointment, first available was January 11, 2019 at 2:30 pm  Patient to return to surgery tomorrow and is hopeful to be discharged Wednesday.   Explain MATCH program and Transitions of Care Pharmacy. Patient voiced understanding and agreeable to use both. Patient has $3 co pay per prescription.   Will continue to follow.   Expected Discharge Plan: Home/Self Care Barriers to Discharge: Continued Medical Work up   Patient Goals and CMS Choice Patient states their goals for this hospitalization and ongoing recovery are:: to go home CMS Medicare.gov Compare Post Acute Care list provided to:: Patient Choice offered to / list presented to : NA  Expected Discharge Plan and Services Expected Discharge Plan: Home/Self Care In-house Referral: Financial Counselor Discharge Planning Services: CM Consult, [facility], [facility], Medication Assistance   Living arrangements for the past 2 months: Single Family Home                 DME Arranged: N/A         HH Arranged: NA          Prior Living Arrangements/Services Living arrangements for the past 2 months: Single Family Home Lives with:: Relatives Patient language and need for interpreter reviewed:: Yes Do you feel safe going back to the place where you live?: Yes      Need for Family Participation in Patient Care: No (Comment) Care giver support system in place?: Yes (comment)   Criminal Activity/Legal Involvement Pertinent to Current Situation/Hospitalization: No - Comment as needed  Activities of Daily  Living Home Assistive Devices/Equipment: None ADL Screening (condition at time of admission) Patient's cognitive ability adequate to safely complete daily activities?: Yes Is the patient deaf or have difficulty hearing?: No Does the patient have difficulty seeing, even when wearing glasses/contacts?: No Does the patient have difficulty concentrating, remembering, or making decisions?: No Patient able to express need for assistance with ADLs?: Yes Does the patient have difficulty dressing or bathing?: Yes Independently performs ADLs?: Yes (appropriate for developmental age) Does the patient have difficulty walking or climbing stairs?: No Weakness of Legs: None Weakness of Arms/Hands: Right  Permission Sought/Granted   Permission granted to share information with : Yes, Verbal Permission Granted     Permission granted to share info w AGENCY: [facility], [facility]        Emotional Assessment Appearance:: Appears stated age Attitude/Demeanor/Rapport: Engaged Affect (typically observed): Accepting Orientation: : Oriented to Self, Oriented to Place, Oriented to  Time, Oriented to Situation Alcohol / Substance Use: Not Applicable Psych Involvement: No (comment)  Admission diagnosis:  High pressure injury of right hand [S69.81XA] Patient Active Problem List   Diagnosis Date Noted  . High-pressure injection injury of finger of right hand 12/18/2018   PCP:  Patient, No Pcp Per Pharmacy:   [facility] [place], [place] [address] Phone: [phone] Fax: [phone]     Social Determinants  of Health (SDOH) Interventions    Readmission Risk Interventions No flowsheet data found.  

## 2018-12-20 NOTE — Plan of Care (Signed)

## 2018-12-20 NOTE — Anesthesia Postprocedure Evaluation (Signed)
Anesthesia Post Note  Patient: General Wearing  Procedure(s) Performed: IRRIGATION AND DEBRIDEMENT HAND (Right )     Patient location during evaluation: PACU Anesthesia Type: General Level of consciousness: awake and alert Pain management: pain level controlled Vital Signs Assessment: post-procedure vital signs reviewed and stable Respiratory status: spontaneous breathing, nonlabored ventilation, respiratory function stable and patient connected to nasal cannula oxygen Cardiovascular status: blood pressure returned to baseline and stable Postop Assessment: no apparent nausea or vomiting Anesthetic complications: no    Last Vitals:  Vitals:   12/20/18 0312 12/20/18 0818  BP: 107/76 109/71  Pulse: 77 65  Resp:    Temp: 36.8 C 36.7 C  SpO2: 98% 99%    Last Pain:  Vitals:   12/20/18 0818  TempSrc: Oral  PainSc:                  Alekai Pocock

## 2018-12-21 ENCOUNTER — Encounter (HOSPITAL_COMMUNITY)
Admission: EM | Disposition: A | Discharge status: Home / Self Care | Payer: Self-pay | Source: Home / Self Care | Attending: Orthopedic Surgery

## 2018-12-21 ENCOUNTER — Inpatient Hospital Stay (HOSPITAL_COMMUNITY): Payer: Medicaid Other | Admitting: Certified Registered"

## 2018-12-21 HISTORY — PX: I & D EXTREMITY: SHX5045

## 2018-12-21 LAB — SURGICAL PCR SCREEN
MRSA, PCR: NEGATIVE
Staphylococcus aureus: NEGATIVE

## 2018-12-21 SURGERY — IRRIGATION AND DEBRIDEMENT EXTREMITY
Anesthesia: General | Site: Hand | Laterality: Right

## 2018-12-21 MED ORDER — PROPOFOL 10 MG/ML IV BOLUS
INTRAVENOUS | Status: DC | PRN
  Administered 2018-12-21: 200 mg via INTRAVENOUS

## 2018-12-21 MED ORDER — EPHEDRINE SULFATE 50 MG/ML IJ SOLN
INTRAMUSCULAR | Status: DC | PRN
  Administered 2018-12-21: 5 mg via INTRAVENOUS

## 2018-12-21 MED ORDER — OXYCODONE HCL 5 MG PO TABS: 5.0000 mg | ORAL_TABLET | Freq: Once | ORAL | Status: DC | PRN

## 2018-12-21 MED ORDER — LIDOCAINE 2% (20 MG/ML) 5 ML SYRINGE
INTRAMUSCULAR | Status: DC | PRN
  Administered 2018-12-21: 60 mg via INTRAVENOUS

## 2018-12-21 MED ORDER — ONDANSETRON HCL 4 MG/2ML IJ SOLN: 4.0000 mg | Freq: Four times a day (QID) | INTRAMUSCULAR | Status: DC | PRN

## 2018-12-21 MED ORDER — GLYCOPYRROLATE PF 0.2 MG/ML IJ SOSY
PREFILLED_SYRINGE | INTRAMUSCULAR | Status: AC
  Filled 2018-12-21: qty 1

## 2018-12-21 MED ORDER — ONDANSETRON HCL 4 MG/2ML IJ SOLN
INTRAMUSCULAR | Status: DC | PRN
  Administered 2018-12-21: 4 mg via INTRAVENOUS

## 2018-12-21 MED ORDER — DEXAMETHASONE SODIUM PHOSPHATE 10 MG/ML IJ SOLN
INTRAMUSCULAR | Status: DC | PRN
  Administered 2018-12-21: 10 mg via INTRAVENOUS

## 2018-12-21 MED ORDER — SODIUM CHLORIDE 0.9 % IR SOLN
Status: DC | PRN
  Administered 2018-12-21: 3000 mL

## 2018-12-21 MED ORDER — MUPIROCIN 2 % EX OINT
1.0000 "application " | TOPICAL_OINTMENT | Freq: Two times a day (BID) | CUTANEOUS | Status: DC
  Administered 2018-12-21 – 2018-12-22 (×2): 1 via NASAL
  Filled 2018-12-21: qty 22

## 2018-12-21 MED ORDER — MIDAZOLAM HCL 2 MG/2ML IJ SOLN
INTRAMUSCULAR | Status: AC
  Filled 2018-12-21: qty 2

## 2018-12-21 MED ORDER — FENTANYL CITRATE (PF) 250 MCG/5ML IJ SOLN
INTRAMUSCULAR | Status: DC | PRN
  Administered 2018-12-21: 50 ug via INTRAVENOUS
  Administered 2018-12-21 (×3): 25 ug via INTRAVENOUS
  Administered 2018-12-21: 50 ug via INTRAVENOUS

## 2018-12-21 MED ORDER — ONDANSETRON HCL 4 MG/2ML IJ SOLN
INTRAMUSCULAR | Status: AC
  Filled 2018-12-21: qty 2

## 2018-12-21 MED ORDER — FENTANYL CITRATE (PF) 100 MCG/2ML IJ SOLN: 25.0000 ug | INTRAMUSCULAR | Status: DC | PRN

## 2018-12-21 MED ORDER — MIDAZOLAM HCL 5 MG/5ML IJ SOLN
INTRAMUSCULAR | Status: DC | PRN
  Administered 2018-12-21: 2 mg via INTRAVENOUS

## 2018-12-21 MED ORDER — GLYCOPYRROLATE PF 0.2 MG/ML IJ SOSY
PREFILLED_SYRINGE | INTRAMUSCULAR | Status: DC | PRN
  Administered 2018-12-21 (×2): .1 mg via INTRAVENOUS

## 2018-12-21 MED ORDER — FENTANYL CITRATE (PF) 250 MCG/5ML IJ SOLN
INTRAMUSCULAR | Status: AC
  Filled 2018-12-21: qty 5

## 2018-12-21 MED ORDER — LIDOCAINE 2% (20 MG/ML) 5 ML SYRINGE
INTRAMUSCULAR | Status: AC
  Filled 2018-12-21: qty 5

## 2018-12-21 MED ORDER — PROPOFOL 10 MG/ML IV BOLUS
INTRAVENOUS | Status: AC
  Filled 2018-12-21: qty 20

## 2018-12-21 MED ORDER — OXYCODONE HCL 5 MG/5ML PO SOLN: 5.0000 mg | Freq: Once | ORAL | Status: DC | PRN

## 2018-12-21 MED ORDER — DEXAMETHASONE SODIUM PHOSPHATE 10 MG/ML IJ SOLN
INTRAMUSCULAR | Status: AC
  Filled 2018-12-21: qty 1

## 2018-12-21 MED ORDER — 0.9 % SODIUM CHLORIDE (POUR BTL) OPTIME
TOPICAL | Status: DC | PRN
  Administered 2018-12-21: 1000 mL

## 2018-12-21 SURGICAL SUPPLY — 45 items
BANDAGE ELASTIC 4 VELCRO ST LF (GAUZE/BANDAGES/DRESSINGS) ×2 IMPLANT
BNDG CONFORM 2 STRL LF (GAUZE/BANDAGES/DRESSINGS) IMPLANT
BNDG ELASTIC 4X5.8 VLCR STR LF (GAUZE/BANDAGES/DRESSINGS) ×3 IMPLANT
BNDG GAUZE ELAST 4 BULKY (GAUZE/BANDAGES/DRESSINGS) ×5 IMPLANT
CORD BIPOLAR FORCEPS 12FT (ELECTRODE) ×3 IMPLANT
COVER SURGICAL LIGHT HANDLE (MISCELLANEOUS) ×3 IMPLANT
COVER WAND RF STERILE (DRAPES) ×3 IMPLANT
CUFF TOURN SGL QUICK 18X4 (TOURNIQUET CUFF) ×3 IMPLANT
CUFF TOURN SGL QUICK 24 (TOURNIQUET CUFF)
CUFF TRNQT CYL 24X4X16.5-23 (TOURNIQUET CUFF) IMPLANT
DRSG ADAPTIC 3X8 NADH LF (GAUZE/BANDAGES/DRESSINGS) ×3 IMPLANT
GAUZE SPONGE 4X4 12PLY STRL (GAUZE/BANDAGES/DRESSINGS) ×3 IMPLANT
GAUZE XEROFORM 1X8 LF (GAUZE/BANDAGES/DRESSINGS) ×3 IMPLANT
GLOVE BIOGEL M 8.0 STRL (GLOVE) ×3 IMPLANT
GLOVE SS BIOGEL STRL SZ 8 (GLOVE) ×1 IMPLANT
GLOVE SUPERSENSE BIOGEL SZ 8 (GLOVE) ×2
GOWN STRL REUS W/ TWL LRG LVL3 (GOWN DISPOSABLE) ×1 IMPLANT
GOWN STRL REUS W/ TWL XL LVL3 (GOWN DISPOSABLE) ×2 IMPLANT
GOWN STRL REUS W/TWL LRG LVL3 (GOWN DISPOSABLE) ×2
GOWN STRL REUS W/TWL XL LVL3 (GOWN DISPOSABLE) ×4
KIT BASIN OR (CUSTOM PROCEDURE TRAY) ×3 IMPLANT
KIT TURNOVER KIT B (KITS) ×3 IMPLANT
MANIFOLD NEPTUNE II (INSTRUMENTS) ×3 IMPLANT
NDL HYPO 25GX1X1/2 BEV (NEEDLE) IMPLANT
NEEDLE HYPO 25GX1X1/2 BEV (NEEDLE) IMPLANT
NS IRRIG 1000ML POUR BTL (IV SOLUTION) ×3 IMPLANT
PACK ORTHO EXTREMITY (CUSTOM PROCEDURE TRAY) ×3 IMPLANT
PAD ARMBOARD 7.5X6 YLW CONV (MISCELLANEOUS) ×3 IMPLANT
PAD CAST 4YDX4 CTTN HI CHSV (CAST SUPPLIES) ×1 IMPLANT
PADDING CAST COTTON 4X4 STRL (CAST SUPPLIES) ×2
PADDING CAST SYNTHETIC 4 (CAST SUPPLIES) ×2
PADDING CAST SYNTHETIC 4X4 STR (CAST SUPPLIES) IMPLANT
SET CYSTO W/LG BORE CLAMP LF (SET/KITS/TRAYS/PACK) ×3 IMPLANT
SOL PREP POV-IOD 4OZ 10% (MISCELLANEOUS) ×6 IMPLANT
SPLINT FIBERGLASS 3X12 (CAST SUPPLIES) ×2 IMPLANT
SPONGE LAP 4X18 RFD (DISPOSABLE) ×3 IMPLANT
SUT PROLENE 4 0 PS 2 18 (SUTURE) ×6 IMPLANT
SWAB CULTURE ESWAB REG 1ML (MISCELLANEOUS) IMPLANT
SYR CONTROL 10ML LL (SYRINGE) IMPLANT
TOWEL GREEN STERILE (TOWEL DISPOSABLE) ×3 IMPLANT
TOWEL GREEN STERILE FF (TOWEL DISPOSABLE) ×3 IMPLANT
TUBE CONNECTING 12'X1/4 (SUCTIONS) ×1
TUBE CONNECTING 12X1/4 (SUCTIONS) ×2 IMPLANT
WATER STERILE IRR 1000ML POUR (IV SOLUTION) ×3 IMPLANT
YANKAUER SUCT BULB TIP NO VENT (SUCTIONS) ×3 IMPLANT

## 2018-12-21 NOTE — Anesthesia Preprocedure Evaluation (Signed)
Anesthesia Evaluation  Patient identified by MRN, date of birth, ID band Patient awake    Reviewed: Allergy & Precautions, H&P , NPO status , Patient's Chart, lab work & pertinent test results  Airway Mallampati: II   Neck ROM: full    Dental   Pulmonary neg pulmonary ROS,    breath sounds clear to auscultation       Cardiovascular negative cardio ROS   Rhythm:regular Rate:Normal     Neuro/Psych    GI/Hepatic   Endo/Other    Renal/GU      Musculoskeletal   Abdominal   Peds  Hematology   Anesthesia Other Findings   Reproductive/Obstetrics                             Anesthesia Physical Anesthesia Plan  ASA: I  Anesthesia Plan: General   Post-op Pain Management:    Induction: Intravenous  PONV Risk Score and Plan: 2 and Ondansetron, Dexamethasone, Midazolam and Treatment may vary due to age or medical condition  Airway Management Planned: LMA  Additional Equipment:   Intra-op Plan:   Post-operative Plan:   Informed Consent: I have reviewed the patients History and Physical, chart, labs and discussed the procedure including the risks, benefits and alternatives for the proposed anesthesia with the patient or authorized representative who has indicated his/her understanding and acceptance.       Plan Discussed with: CRNA, Anesthesiologist and Surgeon  Anesthesia Plan Comments:         Anesthesia Quick Evaluation  

## 2018-12-21 NOTE — Op Note (Signed)
Operative note 12/21/2018  Roseanne Kaufman MD  Preoperative diagnosis high pressure paint gun/latex gun injury to the hand right upper extremity  Postop diagnosis the same  Procedure: #1 irrigation and debridement skin subtenons tissue tendon and associated deep soft tissues including muscle right hand this was an excisional debridement nature #2 neuroplasty radial digital nerve middle finger #3 neuroplasty ulnar digital nerve index finger #4 complex wound closure with rotation flap second webspace right hand  Tameika Heckmann MD  Anesthesia General  Estimate blood loss minimal  Tourniquet time less than an hour.  Operative procedure: Patient was seen by myself and anesthesia taken to the operative theater and underwent a smooth induction of anesthetic informed a general anesthetic.  He was laid spine appropriately padded and prepped and draped in usual sterile fashion with Hibiclens pre-scrub performed by myself followed by 10-minute surgical Betadine scrub and paint.  Once this was complete the patient then underwent removal of prior sutures followed by irrigation debridement of skin subtenons tissue muscle tendon and associated deep soft tissue structures.  Foreign body debridement and removal was accomplished in the form of latex paint which was impregnated into the area.  The wound conditions were much improved there is no signs of infection and I was overall quite pleased to see the findings.  At this time I performed a neuroplasty of the radial digital nerve to the middle finger and a neuroplasty of the ulnar digital nerve to the index finger.  We also traced out the arterial branches including the proper and common digital arteries.  Following this we irrigated with 3 to 4 L of saline followed by a complex wound closure to include rotation flap in the second webspace.  This was inset with combination 4-0 Prolene and allowed for coverage of the neurovascular structures.  As noted in the prior  operative note the patient had some significant removal of skin due to the loss of skin secondary to the injury process.  This necessitated a rotation flap.  This was inset without difficulty.  Patient had good refill no complications and all looked quite well.  The tourniquet was insufflated for a brief period of time and there were no complications.  He was dressed with Adaptic Xeroform 4 x 4 gauze web roll Kerlix and a volar splint.  We will continue IV antibiotics and consider discharge home tomorrow if things are looking well.  All questions have been encouraged and answered  Jalani Cullifer MD

## 2018-12-21 NOTE — Transfer of Care (Signed)
Immediate Anesthesia Transfer of Care Note  Patient: Edward Walker  Procedure(s) Performed: IRRIGATION AND DEBRIDEMENT OF HAND AND REPAIR AS NECESSARY (Right Hand)  Patient Location: PACU  Anesthesia Type:General  Level of Consciousness: awake, alert , oriented and patient cooperative  Airway & Oxygen Therapy: Patient Spontanous Breathing and Patient connected to face mask oxygen  Post-op Assessment: Report given to RN and Post -op Vital signs reviewed and stable  Post vital signs: Reviewed and stable  Last Vitals:  Vitals Value Taken Time  BP 149/81 12/21/18 1725  Temp    Pulse 89 12/21/18 1726  Resp 18 12/21/18 1726  SpO2 99 % 12/21/18 1726  Vitals shown include unvalidated device data.  Last Pain:  Vitals:   12/21/18 1724  TempSrc:   PainSc: (P) 0-No pain      Patients Stated Pain Goal: 5 (00/76/22 6333)  Complications: No apparent anesthesia complications

## 2018-12-21 NOTE — Anesthesia Procedure Notes (Signed)
Procedure Name: LMA Insertion Date/Time: 12/21/2018 4:29 PM Performed by: Cleda Daub, CRNA Pre-anesthesia Checklist: Patient identified, Emergency Drugs available, Suction available and Patient being monitored Patient Re-evaluated:Patient Re-evaluated prior to induction Oxygen Delivery Method: Circle system utilized Preoxygenation: Pre-oxygenation with 100% oxygen Induction Type: IV induction LMA: LMA inserted LMA Size: 5.0 Number of attempts: 1 Placement Confirmation: positive ETCO2 Tube secured with: Tape Dental Injury: Teeth and Oropharynx as per pre-operative assessment

## 2018-12-21 NOTE — Progress Notes (Signed)
Patient has been reminded not to eat or drink after midnight. Patient verbalized understanding.

## 2018-12-21 NOTE — H&P (Signed)
  Patient presents for evaluation and surgical treatment today.  We are planning for irrigation debridement and repair reconstruction is necessary status post high injection latex pain injury to the hand.  He understands all risk and benefits and desires to proceed.  This will be his second washout.  We are planning surgery for your upper extremity. The risk and benefits of surgery to include risk of bleeding, infection, anesthesia,  damage to normal structures and failure of the surgery to accomplish its intended goals of relieving symptoms and restoring function have been discussed in detail. With this in mind we plan to proceed. I have specifically discussed with the patient the pre-and postoperative regime and the dos and don'ts and risk and benefits in great detail. Risk and benefits of surgery also include risk of dystrophy(CRPS), chronic nerve pain, failure of the healing process to go onto completion and other inherent risks of surgery The relavent the pathophysiology of the disease/injury process, as well as the alternatives for treatment and postoperative course of action has been discussed in great detail with the patient who desires to proceed.  We will do everything in our power to help you (the patient) restore function to the upper extremity. It is a pleasure to see this patient today. Debora Stockdale MD

## 2018-12-22 ENCOUNTER — Encounter (HOSPITAL_COMMUNITY): Payer: Self-pay | Admitting: Orthopedic Surgery

## 2018-12-22 LAB — BASIC METABOLIC PANEL
Anion gap: 10 (ref 5–15)
BUN: 15 mg/dL (ref 6–20)
CO2: 22 mmol/L (ref 22–32)
Calcium: 8.8 mg/dL — ABNORMAL LOW (ref 8.9–10.3)
Chloride: 105 mmol/L (ref 98–111)
Creatinine, Ser: 0.99 mg/dL (ref 0.61–1.24)
GFR calc Af Amer: >60 mL/min (ref >60)
GFR calc non Af Amer: >60 mL/min (ref >60)
Glucose, Bld: 179 mg/dL — ABNORMAL HIGH (ref 70–99)
Potassium: 3.9 mmol/L (ref 3.5–5.1)
Sodium: 137 mmol/L (ref 135–145)

## 2018-12-22 MED ORDER — CIPROFLOXACIN HCL 500 MG PO TABS: 500.0000 mg | ORAL_TABLET | Freq: Two times a day (BID) | ORAL | 0 refills | Status: DC

## 2018-12-22 MED ORDER — HYDROCODONE-ACETAMINOPHEN 5-325 MG PO TABS: 2.0000 | ORAL_TABLET | Freq: Four times a day (QID) | ORAL | 0 refills | Status: AC | PRN

## 2018-12-22 MED FILL — HYDROCODON-APAP 5-325: 5-325 | 4 days supply | Qty: 30 | Fill #0

## 2018-12-22 MED FILL — CIPROFLOXACIN HCL 500 MG TA: 500 | 14 days supply | Qty: 28 | Fill #0

## 2018-12-22 NOTE — Discharge Instructions (Signed)
Please elevate and keep your bandage clean and dry.  You may move your fingers within the confines of the bandage.  Please do not get it wet or have it get hot or perspired on.  We recommend vitamin C 1000 mg a day.  Please take the antibiotics until they are gone and call Dr. Amedeo Plenty if you have problems with the antibiotics.  We would like to see you in the office Wednesday at 2 PM next week.  You do not need to call for an appointment please present yourself to the office at that time.  If you have any problems please call Dr. Amedeo Plenty

## 2018-12-22 NOTE — Discharge Summary (Signed)
Physician Discharge Summary  Patient ID: Edward Walker MRN: [id number] DOB/AGE: 38-Feb-1982 38 y.o.  Admit date: 12/18/2018 Discharge date:   Admission Diagnoses: high pressure injury of right hand History reviewed. No pertinent past medical history.  Discharge Diagnoses:  Active Problems:   High-pressure injection injury of finger of right hand   Surgeries: Procedure(s): IRRIGATION AND DEBRIDEMENT OF HAND AND REPAIR AS NECESSARY on 12/21/2018    Consultants:   Discharged Condition: Improved  Hospital Course: Edward Walker is an 38 y.o. male who was admitted 12/18/2018 with a chief complaint of  Chief Complaint  Patient presents with  . Hand Problem  , and found to have a diagnosis of high pressure injury of right hand.  They were brought to the operating room on 12/21/2018 and underwent Procedure(s): IRRIGATION AND DEBRIDEMENT OF HAND AND REPAIR AS NECESSARY.    They were given perioperative antibiotics:  Anti-infectives (From admission, onward)   Start     Dose/Rate Route Frequency Ordered Stop   12/22/18 0000  ciprofloxacin (CIPRO) 500 MG tablet     500 mg Oral 2 times daily 12/22/18 0657     12/19/18 0500  vancomycin (VANCOCIN) 1,500 mg in sodium chloride 0.9 % 500 mL IVPB     1,500 mg 250 mL/hr over 120 Minutes Intravenous Every 12 hours 12/18/18 2023     12/18/18 2330  piperacillin-tazobactam (ZOSYN) IVPB 3.375 g     3.375 g 12.5 mL/hr over 240 Minutes Intravenous Every 8 hours 12/18/18 2017     12/18/18 1615  vancomycin (VANCOCIN) 500 mg in sodium chloride 0.9 % 100 mL IVPB  Status:  Discontinued     500 mg 100 mL/hr over 60 Minutes Intravenous  Once 12/18/18 1614 12/18/18 2001   12/18/18 1600  vancomycin (VANCOCIN) IVPB 1000 mg/200 mL premix     1,000 mg 200 mL/hr over 60 Minutes Intravenous  Once 12/18/18 1557 12/18/18 1630   12/18/18 1600  piperacillin-tazobactam (ZOSYN) IVPB 3.375 g     3.375 g 100 mL/hr over 30 Minutes Intravenous  Once 12/18/18 1557 12/18/18  1635    .  They were given sequential compression devices, early ambulation, and Other (comment) for DVT prophylaxis.  Recent vital signs:  Patient Vitals for the past 24 hrs:  BP Temp Temp src Pulse Resp SpO2  12/22/18 0423 137/69 97.7 F (36.5 C) Oral 63 18 99 %  12/21/18 2319 (!) 148/81 98.4 F (36.9 C) Oral 76 18 100 %  12/21/18 1916 (!) 158/87 98.3 F (36.8 C) Oral 64 16 100 %  12/21/18 1740 (!) 150/83 - - 69 10 100 %  12/21/18 1725 (!) 149/81 - - 92 (!) 31 100 %  12/21/18 1724 (!) 149/81 (!) 97 F (36.1 C) - - - -  12/21/18 0748 114/78 98.5 F (36.9 C) Oral (!) 55 20 100 %  .  Recent laboratory studies: No results found.  Discharge Medications:   Allergies as of 12/22/2018   No Known Allergies     Medication List    TAKE these medications   acetaminophen 500 MG tablet Commonly known as: TYLENOL Take 500-1,000 mg by mouth every 6 (six) hours as needed (for headaches or mild pain).   ciprofloxacin 500 MG tablet Commonly known as: Cipro Take 1 tablet (500 mg total) by mouth 2 (two) times daily.   HYDROcodone-acetaminophen 5-325 MG tablet Commonly known as: Norco Take 2 tablets by mouth every 6 (six) hours as needed for moderate pain.   ibuprofen 200 MG tablet  Commonly known as: ADVIL Take 200-400 mg by mouth every 6 (six) hours as needed (for headaches or mild pain).       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 Remote RIGHT hand fractures 25+ years ago. EXAM: RIGHT HAND - COMPLETE 3+ VIEW COMPARISON:  None. FINDINGS: High attenuation foreign material in the webspace between the index and long fingers. No evidence of acute fracture. Well-preserved bone mineral density. Well preserved joint spaces. No intrinsic osseous abnormalities. IMPRESSION: 1. No osseous abnormality. 2. High attenuation  foreign material in the webspace between the index and long fingers which goes along with the given history of injected paint. Electronically Signed   By: Hulan Saashomas  Lawrence M.D.   On: 12/18/2018 12:42    They benefited maximally from their hospital stay and there were no complications.     Disposition: Discharge disposition: 01-Home or Self Care      Discharge Instructions    Call MD / Call 911   Complete by: As directed    If you experience chest pain or shortness of breath, CALL 911 and be transported to the hospital emergency room.  If you develope a fever above 101 F, pus (white drainage) or increased drainage or redness at the wound, or calf pain, call your surgeon's office.   Constipation Prevention   Complete by: As directed    Drink plenty of fluids.  Prune juice may be helpful.  You may use a stool softener, such as Colace (over the counter) 100 mg twice a day.  Use MiraLax (over the counter) for constipation as needed.   Diet - low sodium heart healthy   Complete by: As directed    Increase activity slowly as tolerated   Complete by: As directed      Follow-up Information    [facility] Follow up.   Why: follow up appointment January 11, 2019 at 2:30 pm  Contact information: [address] [place] [phone]       Dominica SeverinGramig, Italia Wolfert, MD Follow up in 7 day(s).   Specialty: Orthopedic Surgery Why: Please come to the office at 2 PM next Wednesday, December 29, 2018 Contact information: [address] [address] [place] [id number] [phone]           Patient was quite well though he is status post irrigation and debridement at 2 separate surgical settings with repair reconstruction right hand after high-pressure latex gun/paint gun injury to the hand.  Patient is stable today he is awake alert and oriented.  We will discharge him on Cipro twice daily x14 days 500 mg tablet and Norco as needed  pain.  He will elevate move and massage the fingers notify me should any problems occur.  I discussed with patient my issues and concerns.  I am still quite concerned about the viability of the skin and hand.  I will see him in the office as outlined.  No complications during his hospital stay. Signed: Dionne AnoWilliam M Adhya Cocco III 12/22/2018, 6:58 AM

## 2018-12-22 NOTE — Progress Notes (Signed)
Called social work to inform pt needs taxi pass for discharge transportation

## 2018-12-22 NOTE — Anesthesia Postprocedure Evaluation (Signed)
Anesthesia Post Note  Patient: Edward Walker  Procedure(s) Performed: IRRIGATION AND DEBRIDEMENT OF HAND AND REPAIR AS NECESSARY (Right Hand)     Patient location during evaluation: PACU Anesthesia Type: General Level of consciousness: awake and alert Pain management: pain level controlled Vital Signs Assessment: post-procedure vital signs reviewed and stable Respiratory status: spontaneous breathing, nonlabored ventilation, respiratory function stable and patient connected to nasal cannula oxygen Cardiovascular status: blood pressure returned to baseline and stable Postop Assessment: no apparent nausea or vomiting Anesthetic complications: no    Last Vitals:  Vitals:   12/21/18 2319 12/22/18 0423  BP: (!) 148/81 137/69  Pulse: 76 63  Resp: 18 18  Temp: 36.9 C 36.5 C  SpO2: 100% 99%    Last Pain:  Vitals:   12/22/18 0423  TempSrc: Oral  PainSc:                  Shannon S

## 2018-12-22 NOTE — Progress Notes (Signed)
Pt discharge education went over at bedside  Pt has all belongings including laptop, taxi pass, medications from transitional pharamcy and supportive pillow device  Pt IV removed, catheter intact  Pt refused wheelchair, pt ambulated off unit with staff

## 2018-12-22 NOTE — TOC Transition Note (Signed)
Transition of Care [facility]) - CM/SW Discharge Note   Patient Details  Name: Edward Walker MRN: [id number] Date of Birth: 1980/08/15  Transition [facility]) CM/SW Contact:  Marilu Favre, RN Phone Number: 12/22/2018, 8:28 AM   Clinical Narrative:     Prescriptions sent to Transitions of care pharmacy. Patient entered in [facility] . TOC will bring medications to patient's hospital room. Follow up appointment scheduled.     Barriers to Discharge: No Barriers Identified   Patient Goals and CMS Choice Patient states their goals for this hospitalization and ongoing recovery are:: to go home CMS Medicare.gov Compare Post Acute Care list provided to:: Patient Choice offered to / list presented to : NA  Discharge Placement                       Discharge Plan and Services In-house Referral: Financial Counselor Discharge Planning Services: CM Consult, [facility], [place], Medication Assistance            DME Arranged: N/A         HH Arranged: NA          Social Determinants of Health (SDOH) Interventions     Readmission Risk Interventions No flowsheet data found.

## 2018-12-23 ENCOUNTER — Telehealth (HOSPITAL_COMMUNITY): Payer: Self-pay | Admitting: Emergency Medicine

## 2018-12-23 LAB — AEROBIC/ANAEROBIC CULTURE W GRAM STAIN (SURGICAL/DEEP WOUND): Gram Stain: NONE SEEN

## 2018-12-23 NOTE — Telephone Encounter (Signed)
Lab called reporting culture to Urgent Care. Order was placed by Dr. Amedeo Plenty. Called microbiology to inform them that they need to notify Dr. Amedeo Plenty about lab results.

## 2019-01-11 ENCOUNTER — Other Ambulatory Visit: Payer: Self-pay

## 2019-01-11 ENCOUNTER — Ambulatory Visit: Payer: Self-pay | Attending: Nurse Practitioner | Admitting: Nurse Practitioner

## 2019-01-11 ENCOUNTER — Encounter: Payer: Self-pay | Admitting: Nurse Practitioner

## 2019-01-11 DIAGNOSIS — Z7689 Persons encountering health services in other specified circumstances: Secondary | ICD-10-CM

## 2019-01-11 DIAGNOSIS — R7309 Other abnormal glucose: Secondary | ICD-10-CM

## 2019-01-11 DIAGNOSIS — Z09 Encounter for follow-up examination after completed treatment for conditions other than malignant neoplasm: Secondary | ICD-10-CM
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
## 2019-02-14 ENCOUNTER — Encounter: Payer: Self-pay | Admitting: Pharmacist

## 2019-02-14 ENCOUNTER — Ambulatory Visit (HOSPITAL_BASED_OUTPATIENT_CLINIC_OR_DEPARTMENT_OTHER): Payer: Self-pay | Admitting: Pharmacist

## 2019-02-14 ENCOUNTER — Encounter: Payer: Self-pay | Admitting: Nurse Practitioner

## 2019-02-14 ENCOUNTER — Other Ambulatory Visit: Payer: Self-pay

## 2019-02-14 ENCOUNTER — Ambulatory Visit: Payer: Self-pay | Attending: Nurse Practitioner | Admitting: Nurse Practitioner

## 2019-02-14 VITALS — BP 102/61 | HR 80 | Temp 98.8°F | Ht 70.0 in | Wt 170.2 lb

## 2019-02-14 DIAGNOSIS — Z0000 Encounter for general adult medical examination without abnormal findings: Secondary | ICD-10-CM

## 2019-02-14 DIAGNOSIS — Z23 Encounter for immunization: Secondary | ICD-10-CM

## 2019-02-14 LAB — POCT GLYCOSYLATED HEMOGLOBIN (HGB A1C): Hemoglobin A1C: 5.3 % (ref 4.0–5.6)

## 2019-02-14 LAB — GLUCOSE, POCT (MANUAL RESULT ENTRY): POC Glucose: 97 mg/dl (ref 70–99)

## 2019-02-14 NOTE — Progress Notes (Signed)
Patient presents for vaccination against influenza per orders of Zelda. Consent given. Counseling provided. No contraindications exists. Vaccine administered without incident.   

## 2019-02-14 NOTE — Progress Notes (Signed)
Assessment & Plan:  Edward Walker was seen today for annual exam.  Diagnoses and all orders for this visit:  Encounter for annual physical exam -     Lipid panel; Future -     HgB A1c -     Glucose (CBG) -     Lipid panel INSTRUCTIONS: Work on a low fat, heart healthy diet and participate in regular aerobic exercise program by working out at least 150 minutes per week; 5 days a week-30 minutes per day. Avoid red meat, fried foods. junk foods, sodas, sugary drinks, unhealthy snacking, alcohol and smoking.  Drink at least 48oz of water per day and monitor your carbohydrate intake daily.    Patient has been counseled on age-appropriate routine health concerns for screening and prevention. These are reviewed and up-to-date. Referrals have been placed accordingly. Immunizations are up-to-date or declined.    Subjective:   Chief Complaint  Patient presents with  . Annual Exam   HPI Edward Walker Ord 38 y.o. male presents to office today for annual physical exam.   Review of Systems  Constitutional: Negative for fever, malaise/fatigue and weight loss.  HENT: Negative.  Negative for nosebleeds.   Eyes: Negative.  Negative for blurred vision, double vision and photophobia.  Respiratory: Negative.  Negative for cough and shortness of breath.   Cardiovascular: Negative.  Negative for chest pain, palpitations and leg swelling.  Gastrointestinal: Negative.  Negative for heartburn, nausea and vomiting.  Genitourinary: Negative.   Musculoskeletal: Negative for myalgias.       Right hand swelling post op right hand debridement with nerve, tendon and artery repair   Skin: Negative.   Neurological: Negative.  Negative for dizziness, focal weakness, seizures and headaches.  Endo/Heme/Allergies: Negative.   Psychiatric/Behavioral: Negative.  Negative for suicidal ideas.    History reviewed. No pertinent past medical history.  Past Surgical History:  Procedure Laterality Date  . HAND DEBRIDEMENT  Right   . I&D EXTREMITY Right 12/18/2018   Procedure: IRRIGATION AND DEBRIDEMENT HAND;  Surgeon: Dominica SeverinGramig, William, MD;  Location[facility];  Service: Orthopedics;  Laterality: Right;  . I&D EXTREMITY Right 12/21/2018   Procedure: IRRIGATION AND DEBRIDEMENT OF HAND AND REPAIR AS NECESSARY;  Surgeon: Dominica SeverinGramig, William, MD;  Location: [facility];  Service: Orthopedics;  Laterality: Right;  . NERVE, TENDON AND ARTERY REPAIR Right 12/18/2018   Procedure: Nerve, Tendon And Artery Repair;  Surgeon: Dominica SeverinGramig, William, MD;  Location: [facility];  Service: Orthopedics;  Laterality: Right;    Family History  Problem Relation Age of Onset  . Diabetes Neg Hx     Social History Reviewed with no changes to be made today.   Outpatient Medications Prior to Visit  Medication Sig Dispense Refill  . acetaminophen (TYLENOL) 500 MG tablet Take 500-1,000 mg by mouth every 6 (six) hours as needed (for headaches or mild pain).    Marland Kitchen. ibuprofen (ADVIL) 200 MG tablet Take 200-400 mg by mouth every 6 (six) hours as needed (for headaches or mild pain).    . AMOXICILLIN PO Take by mouth.    Marland Kitchen. HYDROcodone-acetaminophen (NORCO) 5-325 MG tablet Take 2 tablets by mouth every 6 (six) hours as needed for moderate pain. (Patient not taking: Reported on 01/11/2019) 30 tablet 0  . ciprofloxacin (CIPRO) 500 MG tablet Take 1 tablet (500 mg total) by mouth 2 (two) times daily. 28 tablet 0   No facility-administered medications prior to visit.     No Known Allergies     Objective:  BP 102/61 (BP Location: Left Arm, Patient Position: Sitting, Cuff Size: Normal)   Pulse 80   Temp 98.8 F (37.1 C) (Oral)   Ht 5\' 10"  (1.778 m)   Wt 170 lb 3.2 oz (77.2 kg)   SpO2 96%   BMI 24.42 kg/m  Wt Readings from Last 3 Encounters:  02/14/19 170 lb 3.2 oz (77.2 kg)  12/18/18 168 lb (76.2 kg)    Physical Exam Constitutional:      Appearance: He is well-developed.  HENT:     Head: Normocephalic and atraumatic.     Jaw: There is normal jaw  occlusion.     Right Ear: Hearing, tympanic membrane, ear canal and external ear normal.     Left Ear: Hearing, tympanic membrane, ear canal and external ear normal.     Nose: Nose normal. No mucosal edema or rhinorrhea.     Mouth/Throat:     Lips: Pink.     Mouth: Mucous membranes are moist.     Dentition: No dental tenderness, gingival swelling, dental caries, dental abscesses or gum lesions.     Tongue: No lesions.     Pharynx: Uvula midline.     Tonsils: No tonsillar exudate. 1+ on the right. 1+ on the left.  Eyes:     General: Lids are normal. No visual field deficit or scleral icterus.    Extraocular Movements: Extraocular movements intact.     Conjunctiva/sclera: Conjunctivae normal.     Pupils: Pupils are equal, round, and reactive to light.  Neck:     Musculoskeletal: Normal range of motion and neck supple.     Thyroid: No thyromegaly.     Trachea: No tracheal deviation.  Cardiovascular:     Rate and Rhythm: Normal rate and regular rhythm.     Pulses:          Dorsalis pedis pulses are 2+ on the right side and 2+ on the left side.       Posterior tibial pulses are 1+ on the right side and 1+ on the left side.     Heart sounds: Normal heart sounds. No murmur. No friction rub. No gallop.   Pulmonary:     Effort: Pulmonary effort is normal. No respiratory distress.     Breath sounds: Normal breath sounds. No wheezing or rales.  Chest:     Chest wall: No mass or tenderness.     Breasts:        Right: No inverted nipple, mass, nipple discharge, skin change or tenderness.        Left: No inverted nipple, mass, nipple discharge, skin change or tenderness.  Abdominal:     General: Bowel sounds are normal. There is no distension.     Palpations: Abdomen is soft. There is no mass.     Tenderness: There is no abdominal tenderness. There is no guarding or rebound.     Hernia: There is no hernia in the left inguinal area.  Genitourinary:    Penis: Normal.      Scrotum/Testes:  Normal.        Right: Mass, tenderness or swelling not present. Right testis is descended. Cremasteric reflex is present.         Left: Mass, tenderness or swelling not present. Left testis is descended. Cremasteric reflex is present.      Epididymis:     Right: Normal.     Left: Normal.  Musculoskeletal: Normal range of motion.        General: No  deformity.     Right hand: He exhibits tenderness and swelling. He exhibits normal range of motion and no bony tenderness. Normal sensation noted.  Feet:     Right foot:     Skin integrity: Callus and dry skin present. No skin breakdown.     Left foot:     Skin integrity: Callus and dry skin present. No skin breakdown.  Lymphadenopathy:     Cervical: No cervical adenopathy.     Lower Body: No right inguinal adenopathy. No left inguinal adenopathy.  Skin:    General: Skin is warm and dry.     Capillary Refill: Capillary refill takes less than 2 seconds.     Findings: No erythema.  Neurological:     Mental Status: He is alert and oriented to person, place, and time.     Cranial Nerves: Cranial nerves are intact. No cranial nerve deficit.     Sensory: Sensation is intact.     Motor: Motor function is intact. No abnormal muscle tone.     Coordination: Coordination is intact. Coordination normal.     Gait: Gait is intact.     Deep Tendon Reflexes: Reflexes normal.  Psychiatric:        Behavior: Behavior normal.        Thought Content: Thought content normal.        Judgment: Judgment normal.          Patient has been counseled extensively about nutrition and exercise as well as the importance of adherence with medications and regular follow-up. The patient was given clear instructions to go to ER or return to medical center if symptoms don't improve, worsen or new problems develop. The patient verbalized understanding.   Follow-up: Return if symptoms worsen or fail to improve.   Claiborne Rigg, FNP-BC [facility] and  Wellness [place], [place] [phone]   02/14/2019, 12:57 PM

## 2019-02-14 NOTE — Patient Instructions (Signed)
Preventive Care 19-38 Years Old, Male Preventive care refers to lifestyle choices and visits with your health care provider that can promote health and wellness. This includes:  A yearly physical exam. This is also called an annual well check.  Regular dental and eye exams.  Immunizations.  Screening for certain conditions.  Healthy lifestyle choices, such as eating a healthy diet, getting regular exercise, not using drugs or products that contain nicotine and tobacco, and limiting alcohol use. What can I expect for my preventive care visit? Physical exam Your health care provider will check:  Height and weight. These may be used to calculate body mass index (BMI), which is a measurement that tells if you are at a healthy weight.  Heart rate and blood pressure.  Your skin for abnormal spots. Counseling Your health care provider may ask you questions about:  Alcohol, tobacco, and drug use.  Emotional well-being.  Home and relationship well-being.  Sexual activity.  Eating habits.  Work and work Statistician. What immunizations do I need?  Influenza (flu) vaccine  This is recommended every year. Tetanus, diphtheria, and pertussis (Tdap) vaccine  You may need a Td booster every 10 years. Varicella (chickenpox) vaccine  You may need this vaccine if you have not already been vaccinated. Human papillomavirus (HPV) vaccine  If recommended by your health care provider, you may need three doses over 6 months. Measles, mumps, and rubella (MMR) vaccine  You may need at least one dose of MMR. You may also need a second dose. Meningococcal conjugate (MenACWY) vaccine  One dose is recommended if you are 45-76 years old and a Market researcher living in a residence hall, or if you have one of several medical conditions. You may also need additional booster doses. Pneumococcal conjugate (PCV13) vaccine  You may need this if you have certain conditions and were not  previously vaccinated. Pneumococcal polysaccharide (PPSV23) vaccine  You may need one or two doses if you smoke cigarettes or if you have certain conditions. Hepatitis A vaccine  You may need this if you have certain conditions or if you travel or work in places where you may be exposed to hepatitis A. Hepatitis B vaccine  You may need this if you have certain conditions or if you travel or work in places where you may be exposed to hepatitis B. Haemophilus influenzae type b (Hib) vaccine  You may need this if you have certain risk factors. You may receive vaccines as individual doses or as more than one vaccine together in one shot (combination vaccines). Talk with your health care provider about the risks and benefits of combination vaccines. What tests do I need? Blood tests  Lipid and cholesterol levels. These may be checked every 5 years starting at age 17.  Hepatitis C test.  Hepatitis B test. Screening   Diabetes screening. This is done by checking your blood sugar (glucose) after you have not eaten for a while (fasting).  Sexually transmitted disease (STD) testing. Talk with your health care provider about your test results, treatment options, and if necessary, the need for more tests. Follow these instructions at home: Eating and drinking   Eat a diet that includes fresh fruits and vegetables, whole grains, lean protein, and low-fat dairy products.  Take vitamin and mineral supplements as recommended by your health care provider.  Do not drink alcohol if your health care provider tells you not to drink.  If you drink alcohol: ? Limit how much you have to 0-2  drinks a day. ? Be aware of how much alcohol is in your drink. In the [place] one drink equals one 12 oz bottle of beer (355 mL), one 5 oz glass of wine (148 mL), or one 1 oz glass of hard liquor (44 mL). Lifestyle  Take daily care of your teeth and gums.  Stay active. Exercise for at least 30 minutes on 5 or  more days each week.  Do not use any products that contain nicotine or tobacco, such as cigarettes, e-cigarettes, and chewing tobacco. If you need help quitting, ask your health care provider.  If you are sexually active, practice safe sex. Use a condom or other form of protection to prevent STIs (sexually transmitted infections). What's next?  Go to your health care provider once a year for a well check visit.  Ask your health care provider how often you should have your eyes and teeth checked.  Stay up to date on all vaccines. This information is not intended to replace advice given to you by your health care provider. Make sure you discuss any questions you have with your health care provider. Document Released: 07/15/2001 Document Revised: 05/13/2018 Document Reviewed: 05/13/2018 Elsevier Patient Education  2020 [employer].

## 2019-02-15 LAB — LIPID PANEL
Chol/HDL Ratio: 3.2 ratio (ref 0.0–5.0)
Cholesterol, Total: 183 mg/dL (ref 100–199)
HDL: 58 mg/dL (ref >39)
LDL Chol Calc (NIH): 107 mg/dL — ABNORMAL HIGH (ref 0–99)
Triglycerides: 98 mg/dL (ref 0–149)
VLDL Cholesterol Cal: 18 mg/dL (ref 5–40)
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
## 2022-04-07 ENCOUNTER — Other Ambulatory Visit: Payer: Self-pay

## 2022-04-07 ENCOUNTER — Encounter (HOSPITAL_COMMUNITY): Payer: Self-pay | Admitting: *Deleted

## 2022-04-07 ENCOUNTER — Ambulatory Visit (HOSPITAL_COMMUNITY)
Admission: EM | Admit: 2022-04-07 | Discharge: 2022-04-07 | Disposition: A | Discharge status: Home / Self Care | Payer: Self-pay | Attending: Physician Assistant | Admitting: Physician Assistant

## 2022-04-07 DIAGNOSIS — N3001 Acute cystitis with hematuria: Secondary | ICD-10-CM | POA: Insufficient documentation

## 2022-04-07 DIAGNOSIS — R319 Hematuria, unspecified: Secondary | ICD-10-CM | POA: Insufficient documentation

## 2022-04-07 DIAGNOSIS — J029 Acute pharyngitis, unspecified: Secondary | ICD-10-CM | POA: Insufficient documentation

## 2022-04-07 DIAGNOSIS — R1084 Generalized abdominal pain: Secondary | ICD-10-CM | POA: Insufficient documentation

## 2022-04-07 DIAGNOSIS — R1013 Epigastric pain: Secondary | ICD-10-CM | POA: Insufficient documentation

## 2022-04-07 LAB — POCT URINALYSIS DIPSTICK, ED / UC
Glucose, UA: NEGATIVE mg/dL
Ketones, ur: 15 mg/dL — AB
Nitrite: POSITIVE — AB
Protein, ur: 30 mg/dL — AB
Specific Gravity, Urine: 1.025 (ref 1.005–1.030)
Urobilinogen, UA: 0.2 mg/dL (ref 0.0–1.0)
pH: 5 (ref 5.0–8.0)

## 2022-04-07 LAB — POCT RAPID STREP A, ED / UC: Streptococcus, Group A Screen (Direct): NEGATIVE

## 2022-04-07 MED ORDER — ONDANSETRON HCL 4 MG PO TABS: 4.0000 mg | ORAL_TABLET | Freq: Three times a day (TID) | ORAL | 0 refills | Status: AC | PRN

## 2022-04-07 MED ORDER — NITROFURANTOIN MONOHYD MACRO 100 MG PO CAPS: 100.0000 mg | ORAL_CAPSULE | Freq: Two times a day (BID) | ORAL | 0 refills | Status: AC

## 2022-04-07 NOTE — ED Provider Notes (Signed)
[facility]    CSN: [id number] Arrival date & time: 04/07/22  1962      History   Chief Complaint Chief Complaint  Patient presents with   Abdominal Pain   Headache   Gastroesophageal Reflux   Fatigue   Hematuria   Sore Throat    HPI Edward Walker is a 41 y.o. male.   41 year old male presents with sore throat, stomach pain, and blood in the urine.  Patient indicates that 3 days ago he ate hamburger late at night around 11 PM.  He indicates that shortly afterwards he started having stomach upset with stomach cramping, nausea, and not feeling well.  Patient indicates that this continued over the past 2 days without vomiting, patient indicates he still having a lot of stomach cramping but did not have any diarrhea.  Patient indicates he was running a mild fever with fatigue and chills with the symptoms.  Patient also indicates that he has a history of having indigestion and heartburn and he was having this at the same time he was having stomach symptoms.  Patient stated he took Pepcid twice daily which helped decrease his indigestion however his stomach cramping and nausea continued.  Patient also indicates that he started having sore throat and painful swallowing with mild fever that started 2 days ago.  Also indicates that he noticed that he had some blood in his urine when the sore throat started.  Patient indicates that today he is feeling better but he is still having some mild stomach cramping.   Abdominal Pain Associated symptoms: hematuria   Headache Associated symptoms: abdominal pain   Gastroesophageal Reflux Associated symptoms include abdominal pain and headaches.  Hematuria Associated symptoms include abdominal pain and headaches.  Sore Throat Associated symptoms include abdominal pain and headaches.    History reviewed. No pertinent past medical history.  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 12/18/2018    Past Surgical History:  Procedure Laterality Date   HAND DEBRIDEMENT Right    I & D EXTREMITY Right 12/18/2018   Procedure: IRRIGATION AND DEBRIDEMENT HAND;  Surgeon: Dominica Severin, MD;  Location: [facility];  Service: Orthopedics;  Laterality: Right;   I & D EXTREMITY Right 12/21/2018   Procedure: IRRIGATION AND DEBRIDEMENT OF HAND AND REPAIR AS NECESSARY;  Surgeon: Dominica Severin, MD;  Location: [facility];  Service: Orthopedics;  Laterality: Right;   NERVE, TENDON AND ARTERY REPAIR Right 12/18/2018   Procedure: Nerve, Tendon And Artery Repair;  Surgeon: Dominica Severin, MD;  Location: [facility];  Service: Orthopedics;  Laterality: Right;       Home Medications    Prior to Admission medications   Medication Sig Start Date End Date Taking? Authorizing Provider  nitrofurantoin, macrocrystal-monohydrate, (MACROBID) 100 MG capsule Take 1 capsule (100 mg total) by mouth 2 (two) times daily. 04/07/22  Yes Ellsworth Lennox, PA-C  ondansetron (ZOFRAN) 4 MG tablet Take 1 tablet (4 mg total) by mouth every 8 (eight) hours as needed for nausea or vomiting. 04/07/22  Yes Ellsworth Lennox, PA-C  acetaminophen (TYLENOL) 500 MG tablet Take 500-1,000 mg by mouth every 6 (six) hours as needed (for headaches or mild pain).    [provider]  AMOXICILLIN PO Take by mouth.    [provider]  HYDROcodone-acetaminophen (NORCO) 5-325 MG tablet Take 2 tablets by mouth every 6 (six) hours as needed for moderate pain. Patient not taking: Reported on 01/11/2019  12/22/18   Dominica Severin, MD  ibuprofen (ADVIL) 200 MG tablet Take 200-400 mg by mouth every 6 (six) hours as needed (for headaches or mild pain).    [provider]    Family History Family History  Problem Relation Age of Onset   Diabetes Neg Hx     Social History Social History   Tobacco Use   Smoking status: Never   Smokeless tobacco: Never  Vaping Use   Vaping Use: Never used  Substance Use Topics    Alcohol use: Not Currently   Drug use: Not Currently     Allergies   Patient has no known allergies.   Review of Systems Review of Systems  Gastrointestinal:  Positive for abdominal pain.  Genitourinary:  Positive for hematuria.  Neurological:  Positive for headaches.     Physical Exam Triage Vital Signs ED Triage Vitals  Enc Vitals Group     BP 04/07/22 1055 129/75     Pulse Rate 04/07/22 1055 77     Resp 04/07/22 1055 18     Temp 04/07/22 1055 99.4 F (37.4 C)     Temp src --      SpO2 04/07/22 1055 100 %     Weight --      Height --      Head Circumference --      Peak Flow --      Pain Score 04/07/22 1051 2     Pain Loc --      Pain Edu? --      Excl. in GC? --    No data found.  Updated Vital Signs BP 129/75   Pulse 77   Temp 99.4 F (37.4 C)   Resp 18   SpO2 100%   Visual Acuity Right Eye Distance:   Left Eye Distance:   Bilateral Distance:    Right Eye Near:   Left Eye Near:    Bilateral Near:     Physical Exam Constitutional:      Appearance: He is well-developed.  HENT:     Right Ear: Tympanic membrane and ear canal normal.     Left Ear: Tympanic membrane and ear canal normal.     Mouth/Throat:     Mouth: Mucous membranes are moist.     Pharynx: Posterior oropharyngeal erythema present. No oropharyngeal exudate.  Cardiovascular:     Rate and Rhythm: Normal rate and regular rhythm.     Heart sounds: Normal heart sounds.  Pulmonary:     Effort: Pulmonary effort is normal.     Breath sounds: Normal breath sounds and air entry. No wheezing, rhonchi or rales.  Abdominal:     General: Abdomen is flat. Bowel sounds are normal.     Palpations: Abdomen is soft.     Tenderness: There is abdominal tenderness in the epigastric area. There is no guarding or rebound.  Lymphadenopathy:     Cervical: No cervical adenopathy.  Neurological:     Mental Status: He is alert.      UC Treatments / Results  Labs (all labs ordered are listed, but  only abnormal results are displayed) Labs Reviewed  POCT URINALYSIS DIPSTICK, ED / UC - Abnormal; Notable for the following components:      Result Value   Bilirubin Urine SMALL (*)    Ketones, ur 15 (*)    Hgb urine dipstick LARGE (*)    Protein, ur 30 (*)    Nitrite POSITIVE (*)    Leukocytes,Ua TRACE (*)  All other components within normal limits  URINE CULTURE  CULTURE, GROUP A STREP ([place])  CBC WITH DIFFERENTIAL/PLATELET  POCT RAPID STREP A, ED / UC    EKG   Radiology No results found.  Procedures Procedures (including critical care time)  Medications Ordered in UC Medications - No data to display  Initial Impression / Assessment and Plan / UC Course  I have reviewed the triage vital signs and the nursing notes.  Pertinent labs & imaging results that were available during my care of the patient were reviewed by me and considered in my medical decision making (see chart for details).    Plan: 1.  The pharyngitis will be treated with the following: A.  Advised Tylenol on regular basis to help decrease the throat pain. B.  Advise using salt water gargles and lozenges to help soothe the sore throat. C.  Throat culture pending. 2.  The abdominal pain will be treated with the following: A.  CBC lab test results are pending. B.  Zofran 4 mg every 6 hours to help reduce stomach cramping and discomfort. 3.  The dyspepsia will be treated with the following: A.  Advised to continue Pepcid 20 mg twice daily to help control the indigestion. 4.  The cystitis and hematuria will be treated with the following: A.  Advised patient to continue fluid intake and observe. B. The Macrobid every 12 hours to treat infection. C.  Urine culture is pending 5.  Patient advised follow-up PCP or return to urgent care if symptoms fail to improve. Final Clinical Impressions(s) / UC Diagnoses   Final diagnoses:  Pharyngitis, unspecified etiology  Generalized abdominal pain  Dyspepsia   Hematuria, unspecified type  Acute cystitis with hematuria     Discharge Instructions      Complete blood count lab test will be completed in 48 hours.  If you do not get a call from this office that indicates the test is negative.  Log onto MyChart in order to view the results when it post in 24 or 48 hours. Advised take the Zofran 4 mg every 6 hours on a regular basis to help decrease stomach cramping and nausea. Advised to continue observing a bland diet and increase clear fluid intake. Advised to follow-up with PCP or return to urgent care if symptoms fail to improve over the next 5 to 7 days.    ED Prescriptions     Medication Sig Dispense Auth. Provider   ondansetron (ZOFRAN) 4 MG tablet Take 1 tablet (4 mg total) by mouth every 8 (eight) hours as needed for nausea or vomiting. 20 tablet Nyoka Lint, PA-C   nitrofurantoin, macrocrystal-monohydrate, (MACROBID) 100 MG capsule Take 1 capsule (100 mg total) by mouth 2 (two) times daily. 10 capsule Nyoka Lint, PA-C      PDMP not reviewed this encounter.   Nyoka Lint, PA-C 04/07/22 1225

## 2022-04-07 NOTE — Discharge Instructions (Addendum)
Complete blood count lab test will be completed in 48 hours.  If you do not get a call from this office that indicates the test is negative.  Log onto MyChart in order to view the results when it post in 24 or 48 hours. Advised take the Zofran 4 mg every 6 hours on a regular basis to help decrease stomach cramping and nausea. Advised to continue observing a bland diet and increase clear fluid intake. Advised to follow-up with PCP or return to urgent care if symptoms fail to improve over the next 5 to 7 days.

## 2022-04-07 NOTE — ED Triage Notes (Signed)
Pt repots he has had hematuria,HA ,fatigue,ABD pain ,reflux and sore throat.

## 2022-04-09 LAB — URINE CULTURE: Culture: >=100000 — AB

## 2022-04-10 LAB — CULTURE, GROUP A STREP (THRC)
